# Patient Record
Sex: Female | Born: 1965 | Race: Black or African American | Hispanic: No | Marital: Single | Smoking: Never smoker
Health system: Southern US, Community
[De-identification: ages and names within clinical notes are randomized; demographics above are authoritative.]

## PROBLEM LIST (undated history)

## (undated) DIAGNOSIS — G5600 Carpal tunnel syndrome, unspecified upper limb: Secondary | ICD-10-CM

---

## 2001-08-05 ENCOUNTER — Emergency Department (HOSPITAL_COMMUNITY): Admission: EM | Admit: 2001-08-05 | Discharge: 2001-08-05 | Payer: Self-pay | Admitting: *Deleted

## 2002-01-30 ENCOUNTER — Emergency Department (HOSPITAL_COMMUNITY): Admission: EM | Admit: 2002-01-30 | Discharge: 2002-01-30 | Payer: Self-pay | Admitting: Emergency Medicine

## 2003-12-30 ENCOUNTER — Emergency Department (HOSPITAL_COMMUNITY): Admission: EM | Admit: 2003-12-30 | Discharge: 2003-12-30 | Payer: Self-pay

## 2004-07-11 ENCOUNTER — Emergency Department (HOSPITAL_COMMUNITY): Admission: EM | Admit: 2004-07-11 | Discharge: 2004-07-11 | Payer: Self-pay | Admitting: *Deleted

## 2004-08-03 ENCOUNTER — Emergency Department (HOSPITAL_COMMUNITY): Admission: EM | Admit: 2004-08-03 | Discharge: 2004-08-03 | Payer: Self-pay | Admitting: Emergency Medicine

## 2004-11-19 ENCOUNTER — Emergency Department (HOSPITAL_COMMUNITY): Admission: EM | Admit: 2004-11-19 | Discharge: 2004-11-19 | Payer: Self-pay | Admitting: Emergency Medicine

## 2004-12-18 ENCOUNTER — Emergency Department (HOSPITAL_COMMUNITY): Admission: EM | Admit: 2004-12-18 | Discharge: 2004-12-18 | Payer: Self-pay | Admitting: Emergency Medicine

## 2005-02-25 ENCOUNTER — Emergency Department (HOSPITAL_COMMUNITY): Admission: EM | Admit: 2005-02-25 | Discharge: 2005-02-25 | Payer: Self-pay | Admitting: Emergency Medicine

## 2005-02-26 ENCOUNTER — Emergency Department (HOSPITAL_COMMUNITY): Admission: EM | Admit: 2005-02-26 | Discharge: 2005-02-26 | Payer: Self-pay | Admitting: Emergency Medicine

## 2005-03-05 ENCOUNTER — Emergency Department (HOSPITAL_COMMUNITY): Admission: EM | Admit: 2005-03-05 | Discharge: 2005-03-05 | Payer: Self-pay | Admitting: Emergency Medicine

## 2005-03-10 ENCOUNTER — Emergency Department (HOSPITAL_COMMUNITY): Admission: EM | Admit: 2005-03-10 | Discharge: 2005-03-10 | Payer: Self-pay | Admitting: Emergency Medicine

## 2005-03-19 ENCOUNTER — Emergency Department (HOSPITAL_COMMUNITY): Admission: EM | Admit: 2005-03-19 | Discharge: 2005-03-19 | Payer: Self-pay | Admitting: *Deleted

## 2005-07-28 ENCOUNTER — Emergency Department (HOSPITAL_COMMUNITY): Admission: EM | Admit: 2005-07-28 | Discharge: 2005-07-28 | Payer: Self-pay | Admitting: Emergency Medicine

## 2005-08-14 ENCOUNTER — Emergency Department (HOSPITAL_COMMUNITY): Admission: EM | Admit: 2005-08-14 | Discharge: 2005-08-15 | Payer: Self-pay | Admitting: Emergency Medicine

## 2005-08-24 ENCOUNTER — Emergency Department (HOSPITAL_COMMUNITY): Admission: EM | Admit: 2005-08-24 | Discharge: 2005-08-24 | Payer: Self-pay | Admitting: Family Medicine

## 2005-09-21 ENCOUNTER — Emergency Department (HOSPITAL_COMMUNITY): Admission: EM | Admit: 2005-09-21 | Discharge: 2005-09-22 | Payer: Self-pay | Admitting: Emergency Medicine

## 2006-03-17 ENCOUNTER — Emergency Department (HOSPITAL_COMMUNITY): Admission: EM | Admit: 2006-03-17 | Discharge: 2006-03-17 | Payer: Self-pay | Admitting: Emergency Medicine

## 2006-06-02 ENCOUNTER — Emergency Department (HOSPITAL_COMMUNITY): Admission: EM | Admit: 2006-06-02 | Discharge: 2006-06-02 | Payer: Self-pay | Admitting: Emergency Medicine

## 2006-06-05 ENCOUNTER — Emergency Department (HOSPITAL_COMMUNITY): Admission: EM | Admit: 2006-06-05 | Discharge: 2006-06-05 | Payer: Self-pay | Admitting: Emergency Medicine

## 2006-07-20 ENCOUNTER — Emergency Department (HOSPITAL_COMMUNITY): Admission: EM | Admit: 2006-07-20 | Discharge: 2006-07-20 | Payer: Self-pay | Admitting: Emergency Medicine

## 2006-07-28 ENCOUNTER — Emergency Department (HOSPITAL_COMMUNITY): Admission: EM | Admit: 2006-07-28 | Discharge: 2006-07-28 | Payer: Self-pay | Admitting: Emergency Medicine

## 2006-07-30 ENCOUNTER — Emergency Department (HOSPITAL_COMMUNITY): Admission: EM | Admit: 2006-07-30 | Discharge: 2006-07-30 | Payer: Self-pay | Admitting: Emergency Medicine

## 2006-08-10 ENCOUNTER — Inpatient Hospital Stay (HOSPITAL_COMMUNITY): Admission: AD | Admit: 2006-08-10 | Discharge: 2006-08-10 | Payer: Self-pay | Admitting: Family Medicine

## 2007-02-16 ENCOUNTER — Emergency Department (HOSPITAL_COMMUNITY): Admission: EM | Admit: 2007-02-16 | Discharge: 2007-02-16 | Payer: Self-pay | Admitting: Emergency Medicine

## 2007-02-24 ENCOUNTER — Emergency Department (HOSPITAL_COMMUNITY): Admission: EM | Admit: 2007-02-24 | Discharge: 2007-02-24 | Payer: Self-pay | Admitting: Emergency Medicine

## 2007-03-15 ENCOUNTER — Emergency Department (HOSPITAL_COMMUNITY): Admission: EM | Admit: 2007-03-15 | Discharge: 2007-03-15 | Payer: Self-pay | Admitting: Emergency Medicine

## 2007-04-18 ENCOUNTER — Emergency Department (HOSPITAL_COMMUNITY): Admission: EM | Admit: 2007-04-18 | Discharge: 2007-04-18 | Payer: Self-pay | Admitting: Emergency Medicine

## 2007-05-02 ENCOUNTER — Emergency Department (HOSPITAL_COMMUNITY): Admission: EM | Admit: 2007-05-02 | Discharge: 2007-05-02 | Payer: Self-pay | Admitting: *Deleted

## 2007-05-10 ENCOUNTER — Emergency Department (HOSPITAL_COMMUNITY): Admission: EM | Admit: 2007-05-10 | Discharge: 2007-05-10 | Payer: Self-pay | Admitting: Emergency Medicine

## 2007-05-22 ENCOUNTER — Emergency Department (HOSPITAL_COMMUNITY): Admission: EM | Admit: 2007-05-22 | Discharge: 2007-05-22 | Payer: Self-pay | Admitting: Emergency Medicine

## 2007-06-02 ENCOUNTER — Ambulatory Visit: Payer: Self-pay | Admitting: Nurse Practitioner

## 2007-06-02 DIAGNOSIS — K029 Dental caries, unspecified: Secondary | ICD-10-CM | POA: Insufficient documentation

## 2007-06-02 LAB — CONVERTED CEMR LAB
Basophils Absolute: 0 10*3/uL (ref 0.0–0.1)
Basophils Relative: 0 % (ref 0–1)
Eosinophils Absolute: 0.1 10*3/uL (ref 0.0–0.7)
Eosinophils Relative: 2 % (ref 0–5)
HCT: 42 % (ref 36.0–46.0)
Hemoglobin: 13.9 g/dL (ref 12.0–15.0)
Lymphocytes Relative: 13 % (ref 12–46)
Lymphs Abs: 1.1 10*3/uL (ref 0.7–4.0)
MCHC: 33.1 g/dL (ref 30.0–36.0)
MCV: 93.8 fL (ref 78.0–100.0)
Monocytes Absolute: 0.6 10*3/uL (ref 0.1–1.0)
Monocytes Relative: 6 % (ref 3–12)
Neutro Abs: 6.8 10*3/uL (ref 1.7–7.7)
Neutrophils Relative %: 79 % — ABNORMAL HIGH (ref 43–77)
Platelets: 320 10*3/uL (ref 150–400)
RBC: 4.48 M/uL (ref 3.87–5.11)
RDW: 14 % (ref 11.5–15.5)
WBC: 8.6 10*3/uL (ref 4.0–10.5)

## 2007-06-06 ENCOUNTER — Emergency Department (HOSPITAL_COMMUNITY): Admission: EM | Admit: 2007-06-06 | Discharge: 2007-06-06 | Payer: Self-pay | Admitting: Emergency Medicine

## 2007-06-24 ENCOUNTER — Telehealth (INDEPENDENT_AMBULATORY_CARE_PROVIDER_SITE_OTHER): Payer: Self-pay | Admitting: Nurse Practitioner

## 2007-06-24 ENCOUNTER — Encounter (INDEPENDENT_AMBULATORY_CARE_PROVIDER_SITE_OTHER): Payer: Self-pay | Admitting: Nurse Practitioner

## 2007-07-06 ENCOUNTER — Emergency Department (HOSPITAL_COMMUNITY): Admission: EM | Admit: 2007-07-06 | Discharge: 2007-07-06 | Payer: Self-pay | Admitting: Emergency Medicine

## 2007-07-25 ENCOUNTER — Emergency Department (HOSPITAL_COMMUNITY): Admission: EM | Admit: 2007-07-25 | Discharge: 2007-07-25 | Payer: Self-pay | Admitting: Emergency Medicine

## 2007-09-21 ENCOUNTER — Emergency Department (HOSPITAL_COMMUNITY): Admission: EM | Admit: 2007-09-21 | Discharge: 2007-09-21 | Payer: Self-pay | Admitting: Emergency Medicine

## 2007-09-29 ENCOUNTER — Telehealth (INDEPENDENT_AMBULATORY_CARE_PROVIDER_SITE_OTHER): Payer: Self-pay | Admitting: Nurse Practitioner

## 2007-10-25 ENCOUNTER — Emergency Department (HOSPITAL_COMMUNITY): Admission: EM | Admit: 2007-10-25 | Discharge: 2007-10-25 | Payer: Self-pay | Admitting: Emergency Medicine

## 2007-10-29 ENCOUNTER — Telehealth (INDEPENDENT_AMBULATORY_CARE_PROVIDER_SITE_OTHER): Payer: Self-pay | Admitting: Nurse Practitioner

## 2007-11-05 ENCOUNTER — Ambulatory Visit: Payer: Self-pay | Admitting: Nurse Practitioner

## 2007-11-05 DIAGNOSIS — R636 Underweight: Secondary | ICD-10-CM | POA: Insufficient documentation

## 2007-11-05 LAB — CONVERTED CEMR LAB: Pap Smear: NEGATIVE

## 2007-11-10 ENCOUNTER — Encounter (INDEPENDENT_AMBULATORY_CARE_PROVIDER_SITE_OTHER): Payer: Self-pay | Admitting: Nurse Practitioner

## 2007-11-10 DIAGNOSIS — B009 Herpesviral infection, unspecified: Secondary | ICD-10-CM | POA: Insufficient documentation

## 2007-11-12 ENCOUNTER — Ambulatory Visit (HOSPITAL_COMMUNITY): Admission: RE | Admit: 2007-11-12 | Discharge: 2007-11-12 | Payer: Self-pay | Admitting: Family Medicine

## 2007-11-12 ENCOUNTER — Encounter (INDEPENDENT_AMBULATORY_CARE_PROVIDER_SITE_OTHER): Payer: Self-pay | Admitting: Nurse Practitioner

## 2007-11-18 ENCOUNTER — Encounter (INDEPENDENT_AMBULATORY_CARE_PROVIDER_SITE_OTHER): Payer: Self-pay | Admitting: Internal Medicine

## 2007-11-20 ENCOUNTER — Ambulatory Visit: Payer: Self-pay | Admitting: Nurse Practitioner

## 2007-12-04 ENCOUNTER — Emergency Department (HOSPITAL_COMMUNITY): Admission: EM | Admit: 2007-12-04 | Discharge: 2007-12-04 | Payer: Self-pay | Admitting: Emergency Medicine

## 2008-01-22 ENCOUNTER — Ambulatory Visit: Payer: Self-pay | Admitting: Family Medicine

## 2008-02-03 ENCOUNTER — Ambulatory Visit: Payer: Self-pay | Admitting: *Deleted

## 2008-03-03 ENCOUNTER — Ambulatory Visit: Payer: Self-pay | Admitting: Family Medicine

## 2008-03-20 ENCOUNTER — Emergency Department (HOSPITAL_COMMUNITY): Admission: EM | Admit: 2008-03-20 | Discharge: 2008-03-20 | Payer: Self-pay | Admitting: Emergency Medicine

## 2008-05-21 ENCOUNTER — Emergency Department (HOSPITAL_COMMUNITY): Admission: EM | Admit: 2008-05-21 | Discharge: 2008-05-21 | Payer: Self-pay | Admitting: Emergency Medicine

## 2008-07-02 ENCOUNTER — Ambulatory Visit: Payer: Self-pay | Admitting: Family Medicine

## 2008-07-05 ENCOUNTER — Emergency Department (HOSPITAL_COMMUNITY): Admission: EM | Admit: 2008-07-05 | Discharge: 2008-07-05 | Payer: Self-pay | Admitting: Emergency Medicine

## 2008-07-28 ENCOUNTER — Encounter: Payer: Self-pay | Admitting: Family Medicine

## 2008-07-28 ENCOUNTER — Ambulatory Visit: Payer: Self-pay | Admitting: Family Medicine

## 2008-07-28 LAB — CONVERTED CEMR LAB
Albumin: 4.1 g/dL (ref 3.5–5.2)
Basophils Absolute: 0 10*3/uL (ref 0.0–0.1)
CO2: 22 meq/L (ref 19–32)
Calcium: 8.9 mg/dL (ref 8.4–10.5)
Chloride: 104 meq/L (ref 96–112)
Eosinophils Relative: 1 % (ref 0–5)
Glucose, Bld: 82 mg/dL (ref 70–99)
HCT: 40.4 % (ref 36.0–46.0)
Hemoglobin: 13.5 g/dL (ref 12.0–15.0)
Lymphocytes Relative: 55 % — ABNORMAL HIGH (ref 12–46)
Monocytes Absolute: 0.3 10*3/uL (ref 0.1–1.0)
Potassium: 4.4 meq/L (ref 3.5–5.3)
Prealbumin: 13.5 mg/dL — ABNORMAL LOW (ref 18.0–45.0)
RDW: 13.2 % (ref 11.5–15.5)
Sodium: 139 meq/L (ref 135–145)
TSH: 0.909 microintl units/mL (ref 0.350–4.500)
Total Bilirubin: 0.9 mg/dL (ref 0.3–1.2)
Total Protein: 7.4 g/dL (ref 6.0–8.3)
Vit D, 25-Hydroxy: 25 ng/mL — ABNORMAL LOW (ref 30–89)

## 2008-07-30 ENCOUNTER — Ambulatory Visit (HOSPITAL_COMMUNITY): Admission: RE | Admit: 2008-07-30 | Discharge: 2008-07-30 | Payer: Self-pay | Admitting: Family Medicine

## 2008-08-02 ENCOUNTER — Emergency Department (HOSPITAL_COMMUNITY): Admission: EM | Admit: 2008-08-02 | Discharge: 2008-08-02 | Payer: Self-pay | Admitting: Emergency Medicine

## 2008-09-07 ENCOUNTER — Emergency Department (HOSPITAL_COMMUNITY): Admission: EM | Admit: 2008-09-07 | Discharge: 2008-09-07 | Payer: Self-pay | Admitting: Emergency Medicine

## 2009-03-22 ENCOUNTER — Telehealth (INDEPENDENT_AMBULATORY_CARE_PROVIDER_SITE_OTHER): Payer: Self-pay | Admitting: *Deleted

## 2009-03-23 ENCOUNTER — Other Ambulatory Visit: Admission: RE | Admit: 2009-03-23 | Discharge: 2009-03-23 | Payer: Self-pay | Admitting: Internal Medicine

## 2009-03-23 ENCOUNTER — Ambulatory Visit: Payer: Self-pay | Admitting: Family Medicine

## 2009-03-23 ENCOUNTER — Encounter (INDEPENDENT_AMBULATORY_CARE_PROVIDER_SITE_OTHER): Payer: Self-pay | Admitting: Internal Medicine

## 2009-03-23 LAB — CONVERTED CEMR LAB
Basophils Absolute: 0 10*3/uL (ref 0.0–0.1)
Basophils Relative: 0 % (ref 0–1)
Chlamydia, DNA Probe: NEGATIVE
Eosinophils Absolute: 0.1 10*3/uL (ref 0.0–0.7)
Eosinophils Relative: 1 % (ref 0–5)
GC Probe Amp, Genital: NEGATIVE
HCT: 40.9 % (ref 36.0–46.0)
Hemoglobin: 13.6 g/dL (ref 12.0–15.0)
Lymphocytes Relative: 42 % (ref 12–46)
Lymphs Abs: 1.5 10*3/uL (ref 0.7–4.0)
MCHC: 33.3 g/dL (ref 30.0–36.0)
MCV: 92.5 fL (ref 78.0–100.0)
Monocytes Absolute: 0.3 10*3/uL (ref 0.1–1.0)
Monocytes Relative: 8 % (ref 3–12)
Neutro Abs: 1.7 10*3/uL (ref 1.7–7.7)
Neutrophils Relative %: 49 % (ref 43–77)
Platelets: 298 10*3/uL (ref 150–400)
RBC: 4.42 M/uL (ref 3.87–5.11)
RDW: 13.8 % (ref 11.5–15.5)
WBC: 3.5 10*3/uL — ABNORMAL LOW (ref 4.0–10.5)

## 2009-07-04 ENCOUNTER — Emergency Department (HOSPITAL_COMMUNITY): Admission: EM | Admit: 2009-07-04 | Discharge: 2009-07-04 | Payer: Self-pay | Admitting: Emergency Medicine

## 2009-11-29 ENCOUNTER — Emergency Department (HOSPITAL_COMMUNITY): Admission: EM | Admit: 2009-11-29 | Discharge: 2009-11-29 | Payer: Self-pay | Admitting: Emergency Medicine

## 2009-12-20 ENCOUNTER — Ambulatory Visit: Payer: Self-pay | Admitting: Internal Medicine

## 2009-12-21 ENCOUNTER — Ambulatory Visit (HOSPITAL_COMMUNITY): Admission: RE | Admit: 2009-12-21 | Discharge: 2009-12-21 | Payer: Self-pay | Admitting: Family Medicine

## 2010-05-14 LAB — CONVERTED CEMR LAB
ALT: 9 units/L (ref 0–35)
AST: 14 units/L (ref 0–37)
Albumin: 4.3 g/dL (ref 3.5–5.2)
Alkaline Phosphatase: 72 units/L (ref 39–117)
Amphetamine Screen, Ur: NEGATIVE
BUN: 11 mg/dL (ref 6–23)
Barbiturate Quant, Ur: NEGATIVE
Basophils Absolute: 0 10*3/uL (ref 0.0–0.1)
Basophils Relative: 0 % (ref 0–1)
Benzodiazepines.: NEGATIVE
Bilirubin Urine: NEGATIVE
Blood in Urine, dipstick: NEGATIVE
CO2: 22 meq/L (ref 19–32)
Calcium: 9 mg/dL (ref 8.4–10.5)
Chlamydia, DNA Probe: NEGATIVE
Chloride: 103 meq/L (ref 96–112)
Cocaine Metabolites: NEGATIVE
Creatinine, Ser: 0.62 mg/dL (ref 0.40–1.20)
Creatinine,U: 151.1 mg/dL
Eosinophils Absolute: 0 10*3/uL (ref 0.0–0.7)
Eosinophils Relative: 1 % (ref 0–5)
GC Probe Amp, Genital: NEGATIVE
Glucose, Bld: 71 mg/dL (ref 70–99)
Glucose, Urine, Semiquant: NEGATIVE
HCT: 40.3 % (ref 36.0–46.0)
Hemoglobin: 13.5 g/dL (ref 12.0–15.0)
KOH Prep: NEGATIVE
Ketones, urine, test strip: NEGATIVE
Lymphocytes Relative: 48 % — ABNORMAL HIGH (ref 12–46)
Lymphs Abs: 1.7 10*3/uL (ref 0.7–4.0)
MCHC: 33.5 g/dL (ref 30.0–36.0)
MCV: 93.9 fL (ref 78.0–100.0)
Marijuana Metabolite: NEGATIVE
Methadone: NEGATIVE
Monocytes Absolute: 0.2 10*3/uL (ref 0.1–1.0)
Monocytes Relative: 7 % (ref 3–12)
Neutro Abs: 1.5 10*3/uL — ABNORMAL LOW (ref 1.7–7.7)
Neutrophils Relative %: 44 % (ref 43–77)
Nitrite: NEGATIVE
OCCULT 1: NEGATIVE
Opiate Screen, Urine: NEGATIVE
Phencyclidine (PCP): NEGATIVE
Platelets: 280 10*3/uL (ref 150–400)
Potassium: 4.3 meq/L (ref 3.5–5.3)
Propoxyphene: NEGATIVE
Protein, U semiquant: NEGATIVE
RBC: 4.29 M/uL (ref 3.87–5.11)
RDW: 14.7 % (ref 11.5–15.5)
Sodium: 138 meq/L (ref 135–145)
Specific Gravity, Urine: >=1.03
TSH: 1.057 microintl units/mL (ref 0.350–4.50)
Total Bilirubin: 1.3 mg/dL — ABNORMAL HIGH (ref 0.3–1.2)
Total Protein: 7.3 g/dL (ref 6.0–8.3)
Urobilinogen, UA: 1
WBC Urine, dipstick: NEGATIVE
WBC: 3.5 10*3/uL — ABNORMAL LOW (ref 4.0–10.5)
pH: 5.5

## 2010-05-21 ENCOUNTER — Emergency Department (HOSPITAL_COMMUNITY)
Admission: EM | Admit: 2010-05-21 | Discharge: 2010-05-21 | Disposition: A | Discharge status: Home / Self Care | Payer: Self-pay | Attending: Emergency Medicine | Admitting: Emergency Medicine

## 2010-05-21 DIAGNOSIS — M25539 Pain in unspecified wrist: Secondary | ICD-10-CM | POA: Insufficient documentation

## 2010-05-21 DIAGNOSIS — G5600 Carpal tunnel syndrome, unspecified upper limb: Secondary | ICD-10-CM | POA: Insufficient documentation

## 2010-07-25 ENCOUNTER — Observation Stay (HOSPITAL_COMMUNITY)
Admission: EM | Admit: 2010-07-25 | Discharge: 2010-07-28 | Disposition: A | Discharge status: Still In Hospital | Payer: Self-pay | Attending: Family Medicine | Admitting: Family Medicine

## 2010-07-25 ENCOUNTER — Emergency Department (HOSPITAL_COMMUNITY): Payer: Self-pay

## 2010-07-25 DIAGNOSIS — R0789 Other chest pain: Principal | ICD-10-CM | POA: Insufficient documentation

## 2010-07-25 DIAGNOSIS — K029 Dental caries, unspecified: Secondary | ICD-10-CM | POA: Insufficient documentation

## 2010-07-25 DIAGNOSIS — R0602 Shortness of breath: Secondary | ICD-10-CM | POA: Insufficient documentation

## 2010-07-25 LAB — CBC
Hemoglobin: 13.2 g/dL (ref 12.0–15.0)
MCH: 30.6 pg (ref 26.0–34.0)
MCHC: 33.2 g/dL (ref 30.0–36.0)
MCV: 92.3 fL (ref 78.0–100.0)
Platelets: 300 10*3/uL (ref 150–400)
RDW: 13.6 % (ref 11.5–15.5)

## 2010-07-25 LAB — COMPREHENSIVE METABOLIC PANEL
ALT: 9 U/L (ref 0–35)
AST: 15 U/L (ref 0–37)
BUN: 10 mg/dL (ref 6–23)
CO2: 25 mEq/L (ref 19–32)
Calcium: 8.8 mg/dL (ref 8.4–10.5)
Chloride: 107 mEq/L (ref 96–112)
Creatinine, Ser: 0.92 mg/dL (ref 0.4–1.2)
GFR calc Af Amer: >60 mL/min (ref >60)
GFR calc non Af Amer: >60 mL/min (ref >60)
Glucose, Bld: 74 mg/dL (ref 70–99)
Potassium: 4.2 mEq/L (ref 3.5–5.1)
Sodium: 138 mEq/L (ref 135–145)
Total Bilirubin: 1.2 mg/dL (ref 0.3–1.2)

## 2010-07-25 LAB — POCT CARDIAC MARKERS
CKMB, poc: <1 ng/mL — ABNORMAL LOW (ref 1.0–8.0)
Myoglobin, poc: 29.3 ng/mL (ref 12–200)
Troponin i, poc: <0.05 ng/mL (ref 0.00–0.09)

## 2010-07-25 LAB — DIFFERENTIAL
Basophils Relative: 0 % (ref 0–1)
Eosinophils Absolute: 0.1 10*3/uL (ref 0.0–0.7)
Eosinophils Relative: 1 % (ref 0–5)
Lymphocytes Relative: 37 % (ref 12–46)
Lymphs Abs: 1.9 10*3/uL (ref 0.7–4.0)
Monocytes Absolute: 0.3 10*3/uL (ref 0.1–1.0)
Monocytes Relative: 6 % (ref 3–12)
Neutro Abs: 2.8 10*3/uL (ref 1.7–7.7)
Neutrophils Relative %: 55 % (ref 43–77)

## 2010-07-25 LAB — CARDIAC PANEL(CRET KIN+CKTOT+MB+TROPI)
CK, MB: 0.5 ng/mL (ref 0.3–4.0)
Relative Index: INVALID (ref 0.0–2.5)
Total CK: 56 U/L (ref 7–177)
Troponin I: <0.01 ng/mL (ref 0.00–0.06)

## 2010-07-25 LAB — CK TOTAL AND CKMB (NOT AT ARMC)
CK, MB: 0.5 ng/mL (ref 0.3–4.0)
Total CK: 59 U/L (ref 7–177)

## 2010-07-26 DIAGNOSIS — R072 Precordial pain: Secondary | ICD-10-CM

## 2010-07-26 LAB — LIPID PANEL: Cholesterol: 132 mg/dL (ref 0–200)

## 2010-07-26 LAB — CBC
HCT: 34.9 % — ABNORMAL LOW (ref 36.0–46.0)
Hemoglobin: 11.6 g/dL — ABNORMAL LOW (ref 12.0–15.0)
MCH: 30.1 pg (ref 26.0–34.0)
MCHC: 33.2 g/dL (ref 30.0–36.0)
MCV: 90.4 fL (ref 78.0–100.0)
Platelets: 269 10*3/uL (ref 150–400)
RDW: 13.5 % (ref 11.5–15.5)
WBC: 4.4 10*3/uL (ref 4.0–10.5)

## 2010-07-26 LAB — BASIC METABOLIC PANEL
BUN: 9 mg/dL (ref 6–23)
CO2: 24 mEq/L (ref 19–32)
Calcium: 8.3 mg/dL — ABNORMAL LOW (ref 8.4–10.5)
Chloride: 110 mEq/L (ref 96–112)
Creatinine, Ser: 0.81 mg/dL (ref 0.4–1.2)
GFR calc Af Amer: >60 mL/min (ref >60)
GFR calc non Af Amer: >60 mL/min (ref >60)
Glucose, Bld: 85 mg/dL (ref 70–99)

## 2010-07-26 LAB — POCT CARDIAC MARKERS: Myoglobin, poc: 26.6 ng/mL (ref 12–200)

## 2010-07-26 LAB — MAGNESIUM: Magnesium: 2.2 mg/dL (ref 1.5–2.5)

## 2010-07-26 LAB — CARDIAC PANEL(CRET KIN+CKTOT+MB+TROPI)
CK, MB: 0.5 ng/mL (ref 0.3–4.0)
CK, MB: 0.6 ng/mL (ref 0.3–4.0)
Relative Index: 0.4 (ref 0.0–2.5)
Total CK: 47 U/L (ref 7–177)
Total CK: 149 U/L (ref 7–177)

## 2010-07-26 LAB — RAPID URINE DRUG SCREEN, HOSP PERFORMED
Barbiturates: NOT DETECTED
Benzodiazepines: NOT DETECTED
Cocaine: NOT DETECTED
Opiates: NOT DETECTED

## 2010-07-26 LAB — HEPATITIS PANEL, ACUTE
Hep B C IgM: NEGATIVE
Hepatitis B Surface Ag: NEGATIVE

## 2010-07-26 LAB — TSH: TSH: 1.853 u[IU]/mL (ref 0.350–4.500)

## 2010-07-26 LAB — BRAIN NATRIURETIC PEPTIDE: Pro B Natriuretic peptide (BNP): <30 pg/mL (ref 0.0–100.0)

## 2010-07-26 LAB — PHOSPHORUS: Phosphorus: 4.3 mg/dL (ref 2.3–4.6)

## 2010-07-27 ENCOUNTER — Inpatient Hospital Stay (HOSPITAL_COMMUNITY): Payer: Self-pay

## 2010-07-27 ENCOUNTER — Inpatient Hospital Stay (HOSPITAL_COMMUNITY)
Admit: 2010-07-27 | Discharge: 2010-07-27 | Disposition: A | Discharge status: Home / Self Care | Payer: 59 | Attending: Cardiology | Admitting: Cardiology

## 2010-07-27 MED ORDER — TECHNETIUM TC 99M TETROFOSMIN IV KIT
30.0000 | PACK | Freq: Once | INTRAVENOUS | Status: AC | PRN
Start: 2010-07-27 — End: 2010-07-27
  Administered 2010-07-27: 30 via INTRAVENOUS

## 2010-07-27 MED ORDER — TECHNETIUM TC 99M TETROFOSMIN IV KIT
10.0000 | PACK | Freq: Once | INTRAVENOUS | Status: AC | PRN
  Administered 2010-07-27: 10 via INTRAVENOUS

## 2010-07-28 ENCOUNTER — Observation Stay (HOSPITAL_COMMUNITY)
Admission: RE | Admit: 2010-07-28 | Discharge: 2010-07-28 | Disposition: A | Discharge status: Home / Self Care | Payer: Self-pay | Source: Ambulatory Visit | Attending: Cardiology | Admitting: Cardiology

## 2010-07-28 DIAGNOSIS — R079 Chest pain, unspecified: Secondary | ICD-10-CM | POA: Insufficient documentation

## 2010-07-28 LAB — PROTIME-INR
INR: 1.18 (ref 0.00–1.49)
Prothrombin Time: 15.2 seconds (ref 11.6–15.2)

## 2010-08-17 NOTE — Cardiovascular Report (Signed)
NAMEAUNESTY, TYSON             ACCOUNT NO.:  [id number]  MEDICAL RECORD NO.:  [id number]           PATIENT TYPE:  I  LOCATION:  [facility]                          FACILITY:  [facility]  PHYSICIAN:  Kyrianna Barletta N. Sharyn Lull, M.D. DATE OF BIRTH:  [birth year]  DATE OF PROCEDURE:  07/28/2010 DATE OF DISCHARGE:                           CARDIAC CATHETERIZATION   PROCEDURES:  Left cardiac catheterization with selective left and right coronary angiography, left ventriculography via right groin using Judkins technique.  SURGEON:  Eduardo Osier. Sharyn Lull, MD  INDICATIONS FOR PROCEDURE:  [name] is a 45 year old black female with no significant past medical history who was admitted on July 25, 2010, because of retrosternal chest pain described as tightness, grade 10/10 while at work.  Denies any nausea, vomiting, or diaphoresis. Denies shortness of breath.  Denies palpitation, lightheadedness, or syncope.  Denies PND, orthopnea, or leg swelling.  Denies exertional chest pain in the past.  Denies exertional dyspnea.  Cardiology consult was called due to recurrent chest pain and nonspecific T-wave changes. Denies any cardiac workup in the past.  The patient was ruled out for MI by serial enzymes and EKG and subsequently underwent stress Myoview which showed inferior and anterior wall reversible ischemia.  Due to recurrent chest pain and positive stress Myoview, discussed with the patient regarding left cath, possible PTCA and stenting, its risks and benefits, i.e., death, MI, stroke, need for emergency CABG, risk of restenosis, local vascular complications, etc., and consented for the procedure.  PROCEDURE:  After obtaining informed consent, the patient was brought to the Cath Lab and was placed on the fluoroscopy table.  Right groin was prepped and draped in the usual fashion.  Xylocaine 1% was used for local anesthesia in the right groin.  With the help of thin-wall needle, a 5-French arterial sheath was  placed.  The sheath was aspirated and flushed.  Next, a 5-French left Judkins catheter was advanced over the wire under fluoroscopic guidance up to the ascending aorta.  Wire was pulled out.  The catheter was aspirated and connected to the manifold. Catheter was further advanced and engaged into left coronary ostium. Multiple views of the left system were taken.  Next, the catheter was disengaged and was pulled out over the wire and was replaced with 5- [place] right Judkins catheter which was advanced over the wire under fluoroscopic guidance up to the ascending aorta.  Wire was pulled out. The catheter was aspirated and connected to the manifold.  Catheter was further advanced and engaged into right coronary ostium.  Multiple views of the right system were taken.  Next, the catheter was disengaged and was pulled out over the wire and was replaced with 5-French pigtail catheter which was advanced over the wire under fluoroscopic guidance up to the ascending aorta.  Wire was pulled out.  The catheter was aspirated and connected to the manifold.  Catheter was further advanced across the aortic valve into the LV.  LV pressures were recorded.  Next, LV-graphy was done in 30-degree RAO position.  Post-angiographic pressures were recorded from LV and then pullback pressures were recorded from aorta.  There was no gradient  across the aortic valve. The patient had brief episode of SVT after LV-graphy and then spontaneously converted back to sinus rhythm.  The patient tolerated the procedure well.  There were no complications.  The patient was transferred to the recovery room in stable condition.     Eduardo Osier. Sharyn Lull, M.D.     [facility]  D:  07/28/2010  T:  07/29/2010  Job:  [id number]  cc:   [facility] Team #4  Electronically Signed by Rinaldo Cloud M.D. on 08/17/2010 08:33:17 AM

## 2010-08-20 NOTE — H&P (Signed)
Julie Li, Julie Li             ACCOUNT NO.:  [id number]  MEDICAL RECORD NO.:  [id number]           PATIENT TYPE:  E  LOCATION:  [facility]                         FACILITY:  [facility]  PHYSICIAN:  Lonia Blood, M.D.      DATE OF BIRTH:  [birth year]  DATE OF ADMISSION:  07/25/2010 DATE OF DISCHARGE:                             HISTORY & PHYSICAL   PRIMARY CARE PHYSICIAN:  She is unassigned.  PRESENTING COMPLAINT:  Chest pain.  HISTORY OF PRESENT ILLNESS:  Patient is a 44-hour-old female with no significant past medical history, who presented with 3 days history of progressive chest pain.  Chest pain got worse and it is worse with exertion.  She has had some nausea in its height.  She also rated it as more like pressure, 5/10.  No radiation.  It is relieved by rest. Denied any GI symptoms.  She has had multiple prior ER visits in the past mainly related to her and dental abscess.  She has also had some pain in the left wrist, but has never been admitted per se.  Patient has no significant risk factors for coronary artery disease, but she seemed to be kind of poor historian.  PAST MEDICAL HISTORY:  Significant for: 1. Dental abscess. 2. History of contusion of the right shoulder. 3. Left carpal tunnel syndrome.  ALLERGIES:  She is allergic to: 1. SULFA. 2. TRAMADOL.  MEDICATIONS:  None at this point.  SOCIAL HISTORY:  She lives here in [place].  She denied any tobacco, alcohol or IV drug use.  She is normally active, but does not exercise on a regular basis.  FAMILY HISTORY:  Mainly hypertension, but denied any history of coronary artery disease.  REVIEW OF SYSTEMS:  All systems reviewed are negative except per HPI.  PHYSICAL EXAMINATION:  VITAL SIGNS:  Temperature 98.4, her blood pressure is 104/79 with pulse 86, respiratory rate 18, sats 100% on room air. GENERAL:  She is awake, alert, oriented.  She is in no acute distress. HEENT:  PERRL.  EOMI.  She has poor dentition.   No pallor.  No jaundice. No rhinorrhea. NECK:  Supple.  No JVD.  No lymphadenopathy. RESPIRATORY:  She has good air entry bilaterally.  No wheezes.  No rales.  No crackles. CARDIOVASCULAR SYSTEM:  She has S1, S2.  No audible murmur. ABDOMEN:  Soft, full, nontender with positive bowel sounds. EXTREMITIES:  Showed no edema, cyanosis or clubbing. SKIN EXAMINATION:  No rashes or ulcers.  LABORATORY DATA:  White count is 5.1, hemoglobin 13.2, platelet of 300,000.  Sodium 138, potassium 4.2, chloride 107, CO2 of 25, glucose 74, BUN 10, creatinine 0.92, calcium 8.8.  The rest of the LFTs within normal.  Initial cardiac enzymes are negative.  DIAGNOSTIC STUDIES:  Chest x-ray showed mild interstitial prominence likely technical.  No findings suspicious for pneumonia.  Her EKG showed normal sinus rhythm with a rate of 84, normal intervals.  She has some mild nonspecific ST changes.  ASSESSMENT:  This a 45 year old female with no known cardiac history and no known risk factors for coronary artery disease presenting with, however, classic angina type chest  pain.  She has complained of exertional chest pain, which makes it a little bit worrisome.  Our plan therefore will be: 1. Chest pain:  Admit the patient for observation overnight.  Our goal     will be to rule out myocardial infarction.  If her enzymes are     negative, patient may benefit from a stress test even though her     risk factors again seen is low, but history wise her symptoms seems     very much in line with coronary artery disease.  No apparent     history of gastroesophageal reflux disease. 2. Dental caries:  Again, this is something that patient has been     battling with and need to continue her care as an outpatient.     Other than that, I will only give her aspirin and watch closely.     Lonia Blood, M.D.     Verlin Grills  D:  07/25/2010  T:  07/25/2010  Job:  [id number]  Electronically Signed by Lonia Blood M.D. on  08/20/2010 10:04:34 PM

## 2010-09-18 ENCOUNTER — Emergency Department (HOSPITAL_COMMUNITY)
Admission: EM | Admit: 2010-09-18 | Discharge: 2010-09-19 | Discharge status: Against Medical Advice | Payer: Self-pay | Attending: Emergency Medicine | Admitting: Emergency Medicine

## 2010-09-18 DIAGNOSIS — R509 Fever, unspecified: Secondary | ICD-10-CM | POA: Insufficient documentation

## 2010-09-18 DIAGNOSIS — R070 Pain in throat: Secondary | ICD-10-CM | POA: Insufficient documentation

## 2010-11-21 ENCOUNTER — Emergency Department (HOSPITAL_COMMUNITY)
Admission: EM | Admit: 2010-11-21 | Discharge: 2010-11-21 | Disposition: A | Discharge status: Home / Self Care | Payer: Self-pay | Attending: Emergency Medicine | Admitting: Emergency Medicine

## 2010-11-21 DIAGNOSIS — K089 Disorder of teeth and supporting structures, unspecified: Secondary | ICD-10-CM | POA: Insufficient documentation

## 2010-11-21 DIAGNOSIS — K029 Dental caries, unspecified: Secondary | ICD-10-CM | POA: Insufficient documentation

## 2010-12-13 ENCOUNTER — Emergency Department (HOSPITAL_COMMUNITY)
Admission: EM | Admit: 2010-12-13 | Discharge: 2010-12-13 | Disposition: A | Discharge status: Home / Self Care | Payer: Self-pay | Attending: Emergency Medicine | Admitting: Emergency Medicine

## 2010-12-13 DIAGNOSIS — M25539 Pain in unspecified wrist: Secondary | ICD-10-CM | POA: Insufficient documentation

## 2010-12-13 DIAGNOSIS — R29898 Other symptoms and signs involving the musculoskeletal system: Secondary | ICD-10-CM | POA: Insufficient documentation

## 2010-12-13 DIAGNOSIS — K089 Disorder of teeth and supporting structures, unspecified: Secondary | ICD-10-CM | POA: Insufficient documentation

## 2010-12-22 ENCOUNTER — Emergency Department (HOSPITAL_COMMUNITY)
Admission: EM | Admit: 2010-12-22 | Discharge: 2010-12-22 | Disposition: A | Discharge status: Home / Self Care | Payer: Self-pay | Attending: Emergency Medicine | Admitting: Emergency Medicine

## 2011-01-10 ENCOUNTER — Emergency Department (HOSPITAL_COMMUNITY)
Admission: EM | Admit: 2011-01-10 | Discharge: 2011-01-10 | Disposition: A | Discharge status: Home / Self Care | Payer: Self-pay | Attending: Emergency Medicine | Admitting: Emergency Medicine

## 2011-01-10 DIAGNOSIS — M549 Dorsalgia, unspecified: Secondary | ICD-10-CM | POA: Insufficient documentation

## 2011-05-08 ENCOUNTER — Encounter (HOSPITAL_COMMUNITY): Payer: Self-pay | Admitting: Emergency Medicine

## 2011-05-08 ENCOUNTER — Emergency Department (HOSPITAL_COMMUNITY)
Admission: EM | Admit: 2011-05-08 | Discharge: 2011-05-08 | Disposition: A | Discharge status: Home / Self Care | Payer: Self-pay | Attending: Emergency Medicine | Admitting: Emergency Medicine

## 2011-05-08 DIAGNOSIS — R209 Unspecified disturbances of skin sensation: Secondary | ICD-10-CM | POA: Insufficient documentation

## 2011-05-08 DIAGNOSIS — M25539 Pain in unspecified wrist: Secondary | ICD-10-CM | POA: Insufficient documentation

## 2011-05-08 DIAGNOSIS — G5600 Carpal tunnel syndrome, unspecified upper limb: Secondary | ICD-10-CM | POA: Insufficient documentation

## 2011-05-08 DIAGNOSIS — M79609 Pain in unspecified limb: Secondary | ICD-10-CM | POA: Insufficient documentation

## 2011-05-08 DIAGNOSIS — G5602 Carpal tunnel syndrome, left upper limb: Secondary | ICD-10-CM

## 2011-05-08 MED ORDER — PREDNISONE 20 MG PO TABS
40.0000 mg | ORAL_TABLET | Freq: Once | ORAL | Status: AC
  Administered 2011-05-08: 40 mg via ORAL
  Filled 2011-05-08: qty 2

## 2011-05-08 MED ORDER — PREDNISONE 10 MG PO TABS: 20.0000 mg | ORAL_TABLET | ORAL | Status: DC

## 2011-05-08 NOTE — ED Notes (Signed)
Ortho paged and at bedside

## 2011-05-08 NOTE — ED Notes (Signed)
Pt c/o left wrist pain x 2 days; pt sts hx of same; pt denies obvious injury; CMS intact

## 2011-05-08 NOTE — ED Provider Notes (Signed)
Medical screening examination/treatment/procedure(s) were performed by non-physician practitioner and as supervising physician I was immediately available for consultation/collaboration.  Gerhard Munch, MD 05/08/11 [phone]

## 2011-05-08 NOTE — Progress Notes (Signed)
Orthopedic Tech Progress Note Patient Details:  Julie Li 06/20/1965 [id number]  Type of Splint: Other (comment) Splint Location: left velcro wrist splint Splint Interventions: Application    Gaye Pollack 05/08/2011, 8:03 AM

## 2011-05-08 NOTE — ED Provider Notes (Signed)
History     CSN: [id number]  Arrival date & time 05/08/11  4098   First MD Initiated Contact with Patient 05/08/11 [phone]      Chief Complaint  Patient presents with  . Wrist Pain     Patient is a 46 y.o. female presenting with wrist pain. The history is provided by the patient.  Wrist Pain This is a recurrent problem. The current episode started yesterday. The problem occurs constantly. The problem has been gradually worsening. Pertinent negatives include no chills, fever, numbness, rash or weakness.  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 C/o throbbing pain that radiates from the wrist proximally to the upper arm that is severe and waxes/wanes. There is sometimes associated numbness to the 2nd and 3rd digits, though pt denies this is present at time of examination. She has been taking aleve for the symptoms with mild improvement. Does report a prior dx of carpal tunnel syndrome with sx that resolved after an unknown time.  History reviewed. No pertinent past medical history.  History reviewed. No pertinent past surgical history.  History reviewed. No pertinent family history.  History  Substance Use Topics  . Smoking status: Never Smoker   . Smokeless tobacco: Not on file  . Alcohol Use: No     Review of Systems  Constitutional: Negative for fever and chills.  Musculoskeletal:       See HPI  Skin: Negative for color change, rash and wound.  Neurological: Negative for weakness and numbness.     Allergies  Sulfonamide derivatives and Tramadol hcl  Home Medications  No current outpatient prescriptions on file.  BP 100/67  Pulse 89  Temp(Src) 98.2 F (36.8 C) (Oral)  Resp 16  SpO2 96%  Physical Exam  Nursing note and vitals reviewed. Constitutional: She is oriented to person, place, and time. She appears well-developed and well-nourished. No distress.  HENT:  Head: Normocephalic and atraumatic.    Neck: Neck supple.  Cardiovascular: Normal rate and regular rhythm.   Pulmonary/Chest: Effort normal.  Musculoskeletal: Normal range of motion. She exhibits tenderness. She exhibits no edema.       Right hand: She exhibits normal range of motion, normal two-point discrimination and normal capillary refill. normal sensation noted. She exhibits no finger abduction, no thumb/finger opposition and no wrist extension trouble.       Positive Tinel sign on the left. Positive Phalen's on the left after 15 seconds.  Neurological: She is alert and oriented to person, place, and time.       Sensation intact to light touch. Good 2 point discrimination.  Skin: Skin is warm and dry. No rash noted. No erythema.    ED Course  Procedures (including critical care time)  Labs Reviewed - No data to display No results found.   Dx 1: Carpal tunnel syndrome, left   MDM  Exam c/w CTS. No gross neuro or motor deficits. Splint applied. Will provide short steroid course and advised continued NSAID use for pain. Recommended ortho f/u for further testing/tx if no improvement after conservative tx measures        Shaaron Adler, [place] 05/08/11 [phone]

## 2011-05-26 ENCOUNTER — Emergency Department (HOSPITAL_COMMUNITY)
Admission: EM | Admit: 2011-05-26 | Discharge: 2011-05-26 | Disposition: A | Discharge status: Home / Self Care | Payer: Self-pay | Attending: Emergency Medicine | Admitting: Emergency Medicine

## 2011-05-26 ENCOUNTER — Encounter (HOSPITAL_COMMUNITY): Payer: Self-pay

## 2011-05-26 DIAGNOSIS — R05 Cough: Secondary | ICD-10-CM | POA: Insufficient documentation

## 2011-05-26 DIAGNOSIS — R5381 Other malaise: Secondary | ICD-10-CM | POA: Insufficient documentation

## 2011-05-26 DIAGNOSIS — J111 Influenza due to unidentified influenza virus with other respiratory manifestations: Secondary | ICD-10-CM | POA: Insufficient documentation

## 2011-05-26 DIAGNOSIS — IMO0001 Reserved for inherently not codable concepts without codable children: Secondary | ICD-10-CM | POA: Insufficient documentation

## 2011-05-26 DIAGNOSIS — J3489 Other specified disorders of nose and nasal sinuses: Secondary | ICD-10-CM | POA: Insufficient documentation

## 2011-05-26 DIAGNOSIS — R059 Cough, unspecified: Secondary | ICD-10-CM | POA: Insufficient documentation

## 2011-05-26 DIAGNOSIS — R509 Fever, unspecified: Secondary | ICD-10-CM | POA: Insufficient documentation

## 2011-05-26 MED ORDER — OSELTAMIVIR PHOSPHATE 75 MG PO CAPS: 75.0000 mg | ORAL_CAPSULE | Freq: Two times a day (BID) | ORAL | Status: AC

## 2011-05-26 MED ORDER — OSELTAMIVIR PHOSPHATE 75 MG PO CAPS: 75.0000 mg | ORAL_CAPSULE | Freq: Two times a day (BID) | ORAL | Status: DC

## 2011-05-26 NOTE — ED Notes (Signed)
Pt states she has had generalized body aches and fever since yesterday.  Congestion and cough.  Pt states she had fever of 103 at home.  Took mucinex and robitussin about 1900 tonight.  Pt states pain rating is 9/10 for general all over body aches. Pt alert and oriented x4.  Denies N/V/D

## 2011-05-26 NOTE — ED Provider Notes (Signed)
History     CSN: [id number]  Arrival date & time 05/26/11  1943   First MD Initiated Contact with Patient 05/26/11 1957      Chief Complaint  Patient presents with  . Generalized Body Aches    pt states "I think I have the flu"     HPI  Hx provided by pt and significant other.  Julie Li is a 46 y.o. female who presents to the Emergency Department complaining of fever, body aches, and nasal congestion that began yesterday morning after waking up.  Pt was in contact with landlord who was sick recently with similar symptoms. She reports having a fever of 103 at home. Patient has been taking a fever reducer and Robitussin and Mucinex. Patient reports continued bodyaches and fatigue. She also reports slight cough developing later this evening. She denies any chest pain or shortness of breath. She denies any episodes of nausea vomiting diarrhea. Patient has slightly decreased appetite but is otherwise eating and drinking normally. Patient denies any aggravating or alleviating factors. Symptoms are described as moderate. Patient did not receive a flu vaccination this season. Patient has no other significant medical conditions.        History reviewed. No pertinent past medical history.  History reviewed. No pertinent past surgical history.  No family history on file.  History  Substance Use Topics  . Smoking status: Never Smoker   . Smokeless tobacco: Not on file  . Alcohol Use: No    OB History    Grav Para Term Preterm Abortions TAB SAB Ect Mult Living                  Review of Systems  Constitutional: Positive for fever, chills, appetite change and fatigue.  HENT: Positive for congestion and rhinorrhea. Negative for sore throat.   Respiratory: Negative for cough and shortness of breath.   Cardiovascular: Negative for chest pain.  Gastrointestinal: Negative for nausea, vomiting, abdominal pain, diarrhea and constipation.  Musculoskeletal: Positive for myalgias.    Skin: Negative for rash.  All other systems reviewed and are negative.    Allergies  Sulfonamide derivatives and Tramadol hcl  Home Medications   Current Outpatient Rx  Name Route Sig Dispense Refill  . GUAIFENESIN ER 600 MG PO TB12 Oral Take 1,200 mg by mouth 2 (two) times daily as needed. For cough.    . GUAIFENESIN 100 MG/5ML PO SOLN Oral Take 5 mLs by mouth every 4 (four) hours as needed. For cough, allergies.      BP 121/79  Pulse 102  Temp(Src) 100 F (37.8 C) (Oral)  Resp 20  SpO2 98%  LMP 05/04/2011  Physical Exam  Nursing note and vitals reviewed. Constitutional: She is oriented to person, place, and time. She appears well-developed and well-nourished. No distress.  HENT:  Head: Normocephalic.  Right Ear: External ear normal.  Left Ear: External ear normal.  Mouth/Throat: Oropharynx is clear and moist.       Rhinnorrhea  Neck: Normal range of motion. Neck supple.       No meningeal signs   Cardiovascular: Normal rate and regular rhythm.   Pulmonary/Chest: Effort normal and breath sounds normal. No respiratory distress. She has no wheezes. She has no rales.  Abdominal: Soft.  Neurological: She is alert and oriented to person, place, and time.  Skin: Skin is warm and dry. No rash noted.  Psychiatric: She has a normal mood and affect. Her behavior is normal.    ED  Course  Procedures   1. Influenza       MDM  8:10PM pt seen and evaluated. She in no acute distress. Patient with normal respirations and good O2 sats. Lungs are clear on exam. Symptoms are consistent with viral infection.   Medical screening examination/treatment/procedure(s) were performed by non-physician practitioner and as supervising physician I was immediately available for consultation/collaboration. Osvaldo Human, M.D.      Phill Mutter [place], PA 05/27/11 1610  Carleene Cooper III, MD 05/27/11 [phone]

## 2011-05-26 NOTE — ED Notes (Signed)
Pt c/o body aches, sore throat, nose is stopped up, etc.

## 2011-08-29 ENCOUNTER — Encounter (HOSPITAL_COMMUNITY): Payer: Self-pay

## 2011-08-29 ENCOUNTER — Emergency Department (HOSPITAL_COMMUNITY)
Admission: EM | Admit: 2011-08-29 | Discharge: 2011-08-29 | Disposition: A | Discharge status: Home / Self Care | Payer: Self-pay | Attending: Emergency Medicine | Admitting: Emergency Medicine

## 2011-08-29 DIAGNOSIS — K089 Disorder of teeth and supporting structures, unspecified: Secondary | ICD-10-CM | POA: Insufficient documentation

## 2011-08-29 DIAGNOSIS — R51 Headache: Secondary | ICD-10-CM | POA: Insufficient documentation

## 2011-08-29 DIAGNOSIS — K0889 Other specified disorders of teeth and supporting structures: Secondary | ICD-10-CM

## 2011-08-29 DIAGNOSIS — K0510 Chronic gingivitis, plaque induced: Secondary | ICD-10-CM | POA: Insufficient documentation

## 2011-08-29 MED ORDER — NAPROXEN 500 MG PO TABS: 500.0000 mg | ORAL_TABLET | Freq: Two times a day (BID) | ORAL | Status: DC

## 2011-08-29 MED ORDER — HYDROCODONE-ACETAMINOPHEN 5-325 MG PO TABS
1.0000 | ORAL_TABLET | Freq: Once | ORAL | Status: AC
  Administered 2011-08-29: 1 via ORAL
  Filled 2011-08-29: qty 1

## 2011-08-29 MED ORDER — HYDROCODONE-ACETAMINOPHEN 5-325 MG PO TABS: ORAL_TABLET | ORAL | Status: AC

## 2011-08-29 MED ORDER — PENICILLIN V POTASSIUM 500 MG PO TABS: 500.0000 mg | ORAL_TABLET | Freq: Three times a day (TID) | ORAL | Status: AC

## 2011-08-29 NOTE — ED Notes (Signed)
C/o pain to left front upper tooth; obvious decay noted to all teeth in upper and lower parts of mouth; extremely poor dentation; states has been unable to get in with dentist thus far

## 2011-08-29 NOTE — ED Provider Notes (Signed)
History     CSN: [id number]  Arrival date & time 08/29/11  1455   First MD Initiated Contact with Patient 08/29/11 1527      Chief Complaint  Patient presents with  . Dental Pain    (Consider location/radiation/quality/duration/timing/severity/associated sxs/prior treatment) HPI Comments: Patient states she chipped L maxillary second incisor yesterday. Has had pain since. No facial or neck swelling. No trouble breathing. Taking aleve without relief. No fever. Nothing makes pain better or worse. Pain radiates to L jaw. Pain is throbbing.   Patient is a 46 y.o. female presenting with tooth pain. The history is provided by the patient.  Dental PainThe primary symptoms include mouth pain, dental injury and headaches. Primary symptoms do not include oral lesions, fever, shortness of breath, sore throat or angioedema. The symptoms began yesterday. The symptoms are unchanged. The symptoms are new.  Additional symptoms do not include: facial swelling, trouble swallowing and ear pain.    History reviewed. No pertinent past medical history.  History reviewed. No pertinent past surgical history.  History reviewed. No pertinent family history.  History  Substance Use Topics  . Smoking status: Never Smoker   . Smokeless tobacco: Not on file  . Alcohol Use: No    OB History    Grav Para Term Preterm Abortions TAB SAB Ect Mult Living                  Review of Systems  Constitutional: Negative for fever.  HENT: Positive for dental problem. Negative for ear pain, sore throat, facial swelling, trouble swallowing and neck pain.   Respiratory: Negative for shortness of breath and stridor.   Skin: Negative for color change.  Neurological: Positive for headaches.    Allergies  Sulfonamide derivatives and Tramadol hcl  Home Medications  No current outpatient prescriptions on file.  BP 95/64  Pulse 91  Temp(Src) 98.2 F (36.8 C) (Oral)  Resp 18  SpO2 100%  Physical Exam    Nursing note and vitals reviewed. Constitutional: She is oriented to person, place, and time. She appears well-developed and well-nourished.  HENT:  Head: Normocephalic and atraumatic. No trismus in the jaw.  Right Ear: Tympanic membrane, external ear and ear canal normal.  Left Ear: Tympanic membrane, external ear and ear canal normal.  Nose: Nose normal.  Mouth/Throat: Uvula is midline, oropharynx is clear and moist and mucous membranes are normal. Abnormal dentition. Dental caries present. No dental abscesses or uvula swelling. No tonsillar abscesses.       Advanced periodontal disease. Patient with L maxillary tooth pain and tenderness to palpation in area of L 2nd incisior which is broken to gumline. Multiple surrounding teeth missing. Generalized redness of gums without swelling.   Eyes: Conjunctivae are normal.  Neck: Normal range of motion. Neck supple.       No neck swelling or Ludwig's angina  Lymphadenopathy:    She has no cervical adenopathy.  Neurological: She is alert and oriented to person, place, and time.  Skin: Skin is warm and dry.  Psychiatric: She has a normal mood and affect.    ED Course  Procedures (including critical care time)  Labs Reviewed - No data to display No results found.   1. Toothache   2. Gingivitis     3:58 PM Patient seen and examined. Medications ordered.   Vital signs reviewed and are as follows: Filed Vitals:   08/29/11 1500  BP: 95/64  Pulse: 91  Temp: 98.2 F (36.8 C)  Resp: 18   3:58 PM Patient counseled to take prescribed medications as directed, return with worsening facial or neck swelling, and to follow-up with his dentist as soon as possible.   3:58 PM Patient counseled on use of narcotic pain medications. Counseled not to combine these medications with others containing tylenol. Urged not to drink alcohol, drive, or perform any other activities that requires focus while taking these medications. The patient verbalizes  understanding and agrees with the plan.  MDM  Patient with toothache.  No gross abscess.  Exam unconcerning for Ludwig's angina or other deep tissue infection in neck.  Will treat with penicillin and pain medicine.  Urged patient to follow-up with dentist.           Renne Crigler, PA 08/29/11 [phone]

## 2011-08-29 NOTE — ED Provider Notes (Signed)
Medical screening examination/treatment/procedure(s) were performed by non-physician practitioner and as supervising physician I was immediately available for consultation/collaboration.  Doug Sou, MD 08/29/11 2128

## 2011-08-29 NOTE — ED Notes (Signed)
Pt complains of dental pain since yesterday unable to get in with dentist

## 2011-08-29 NOTE — Discharge Instructions (Signed)
Please read and follow all provided instructions.  Your diagnoses today include:  1. Toothache   2. Gingivitis     Tests performed today include:  Vital signs. See below for your results today.   Medications prescribed:   Vicodin (hydrocodone/acetaminophen) - narcotic pain medication  You have been prescribed narcotic pain medication such as Vicodin, Percocet, or Ultram: DO NOT drive or perform any activities that require you to be awake and alert because this medicine can make you drowsy. BE VERY CAREFUL not to take multiple medicines containing Tylenol (also called acetaminophen). Doing so can lead to an overdose which can damage your liver and cause liver failure and possibly death.    Naproxen - anti-inflammatory pain medication  Do not exceed 500mg  naproxen every 12 hours  You have been prescribed an anti-inflammatory medication or NSAID. Take with food. Take smallest effective dose for the shortest duration needed for your pain. Stop taking if you experience stomach pain or vomiting.    Penicillin - antibiotic for dental infection  You have been prescribed an antibiotic medicine: take the entire course of medicine even if you are feeling better. Stopping early can cause the antibiotic not to work.  Take any prescribed medications only as directed.  Home care instructions:  Follow any educational materials contained in this packet.  Follow-up instructions: Please follow-up with your dentist for further evaluation of your symptoms. If you do not have a dentist or primary care doctor -- see below for referral information.   The exam and treatment you received today has been provided on an emergency basis only. This is not a substitute for complete medical or dental care. If your problem worsens or new symptoms (problems) appear, and you are unable to arrange prompt follow-up care with your dentist, return to this location.  Return instructions:   Please return to the  Emergency Department if you experience worsening symptoms.  Please return if you develop a fever, you develop more swelling in your face or neck, you have trouble breathing or swallowing food.  Please return if you have any other emergent concerns.  Additional Information:  Your vital signs today were: BP 95/64  Pulse 91  Temp(Src) 98.2 F (36.8 C) (Oral)  Resp 18  SpO2 100% If your blood pressure (BP) was elevated above 135/85 this visit, please have this repeated by your doctor within one month. -------------- No Primary Care Doctor Call [facility]  [phone] Other agencies that provide inexpensive medical care    [facility]  [phone]    [facility]  [phone]    [facility]  [phone]    [facility]  [phone]    [facility]  [phone]    [facility]  [phone] -------------- RESOURCE GUIDE:  Dental Problems  Patients with Medicaid: [facility] [phone] [address]                                            [phone] [address] Phone:  [phone]  Phone:  [phone]  If unable to pay or uninsured, contact:  [facility] or [facility]. to become qualified for the adult dental clinic.  Chronic Pain Problems Contact [facility]  [phone] Patients need to be referred by their primary care doctor.  Insufficient Money for Medicine Contact [employer]:  call "211" or [facility] [phone].  [facility]  [phone] [facility]  [phone] [facility]   [phone] (emergency services [phone])  Sub[facility] Abuse [facility]  [phone] [facility] [phone] [facility] [place] [phone] Floydene Flock [phone] Residential & Outpatient Substance Abuse Program   [phone]  Abuse/Neglect [facility] Child Abuse Hotline [phone] [facility] Child Abuse Hotline [phone] (After Hours)  Emergency Shelter [facility] [phone]  Maternity Homes Room at the [facility] [phone] [facility] [phone]  [facility]  [facility]     [employer]                          [facility] Dept. [address] [place]                       [address]      [address]  Blondell Reveal Phone:  [phone]                                   Phone:  [phone]                 Phone:  [phone]  [facility] Mental Health Phone:  [phone]  [facility] Child Abuse Hotline (250)721-0477 [phone])[phone] (After Hours)

## 2011-09-26 ENCOUNTER — Ambulatory Visit: Payer: Self-pay

## 2011-10-28 ENCOUNTER — Encounter (HOSPITAL_COMMUNITY): Payer: Self-pay | Admitting: Emergency Medicine

## 2011-10-28 ENCOUNTER — Emergency Department (HOSPITAL_COMMUNITY)
Admission: EM | Admit: 2011-10-28 | Discharge: 2011-10-28 | Disposition: A | Discharge status: Home / Self Care | Payer: Self-pay | Attending: Emergency Medicine | Admitting: Emergency Medicine

## 2011-10-28 DIAGNOSIS — K089 Disorder of teeth and supporting structures, unspecified: Secondary | ICD-10-CM | POA: Insufficient documentation

## 2011-10-28 DIAGNOSIS — K0889 Other specified disorders of teeth and supporting structures: Secondary | ICD-10-CM

## 2011-10-28 MED ORDER — IBUPROFEN 600 MG PO TABS: 600.0000 mg | ORAL_TABLET | Freq: Four times a day (QID) | ORAL | Status: AC | PRN

## 2011-10-28 MED ORDER — PENICILLIN V POTASSIUM 500 MG PO TABS: 500.0000 mg | ORAL_TABLET | Freq: Three times a day (TID) | ORAL | Status: AC

## 2011-10-28 MED ORDER — OXYCODONE-ACETAMINOPHEN 5-325 MG PO TABS: 1.0000 | ORAL_TABLET | Freq: Four times a day (QID) | ORAL | Status: AC | PRN

## 2011-10-28 NOTE — ED Notes (Signed)
Pt. Stated. I've had a toothache since yesterday.

## 2011-10-28 NOTE — ED Provider Notes (Signed)
History  Scribed for Ethelda Chick, MD, the patient was seen in room TR07C/TR07C. This chart was scribed by Candelaria Stagers. The patient's care started at 11:50 AM   CSN: [id number]  Arrival date & time 10/28/11  1124   None     Chief Complaint  Patient presents with  . Dental Pain     The history is provided by the patient.   Julie Li is a 46 y.o. female who presents to the Emergency Department complaining of left lower dental pain that started yesterday.  She denies fever, trouble swallowing, trouble breathing.  She has never experienced these sx before.  Nothing seems to make the pain better or worse.  She has not taken anything for the symptoms prior to arrival.  No fever/chills.  There are no other associated systemic symptoms, there are no other alleviating or modifying factors.  History reviewed. No pertinent past medical history.  History reviewed. No pertinent past surgical history.  No family history on file.  History  Substance Use Topics  . Smoking status: Never Smoker   . Smokeless tobacco: Not on file  . Alcohol Use: No    OB History    Grav Para Term Preterm Abortions TAB SAB Ect Mult Living                  Review of Systems  Constitutional: Negative for fever.  HENT: Positive for dental problem (lower left ). Negative for trouble swallowing.   Respiratory: Negative for shortness of breath.   All other systems reviewed and are negative.    Allergies  Sulfonamide derivatives and Tramadol hcl  Home Medications   Current Outpatient Rx  Name Route Sig Dispense Refill  . IBUPROFEN 600 MG PO TABS Oral Take 1 tablet (600 mg total) by mouth every 6 (six) hours as needed for pain. 30 tablet 0  . OXYCODONE-ACETAMINOPHEN 5-325 MG PO TABS Oral Take 1-2 tablets by mouth every 6 (six) hours as needed for pain. 15 tablet 0  . PENICILLIN V POTASSIUM 500 MG PO TABS Oral Take 1 tablet (500 mg total) by mouth 3 (three) times daily. 30 tablet 0    BP  111/76  Pulse 97  Temp 98.1 F (36.7 C) (Oral)  SpO2 99%  LMP 10/12/2011  Physical Exam  Nursing note and vitals reviewed. Constitutional: She is oriented to person, place, and time. She appears well-developed and well-nourished. No distress.  HENT:  Head: Normocephalic and atraumatic.       Poor dentition.  Lower left lateral incisor.  No gingival erythema or swelling.    Eyes: Right eye exhibits no discharge. Left eye exhibits no discharge.  Neck: Normal range of motion.  Musculoskeletal: Normal range of motion. She exhibits no edema and no tenderness.  Lymphadenopathy:    She has no cervical adenopathy.  Neurological: She is alert and oriented to person, place, and time.  Skin: Skin is warm and dry. She is not diaphoretic.  Psychiatric: She has a normal mood and affect. Her behavior is normal. Judgment and thought content normal.    ED Course  Procedures   DIAGNOSTIC STUDIES: Oxygen Saturation is 99% on room air, normal by my interpretation.    COORDINATION OF CARE:     Labs Reviewed - No data to display No results found.   1. Pain, dental       MDM  Pt presents with c/o dental pain, no periapical abscess at this time.  Pt started on pain  medications and antibitoics.  Encouraged to arrange for followup with dentist.  Discharged with strict return precautions.  Pt agreeable with plan.   I personally performed the services described in this documentation, which was scribed in my presence. The recorded information has been reviewed and considered.         Ethelda Chick, MD 10/28/11 1350

## 2012-02-02 ENCOUNTER — Encounter (HOSPITAL_COMMUNITY): Payer: Self-pay | Admitting: Emergency Medicine

## 2012-02-02 ENCOUNTER — Emergency Department (HOSPITAL_COMMUNITY): Payer: Self-pay

## 2012-02-02 ENCOUNTER — Emergency Department (HOSPITAL_COMMUNITY)
Admission: EM | Admit: 2012-02-02 | Discharge: 2012-02-02 | Disposition: A | Discharge status: Home / Self Care | Payer: Self-pay | Attending: Emergency Medicine | Admitting: Emergency Medicine

## 2012-02-02 DIAGNOSIS — Z882 Allergy status to sulfonamides status: Secondary | ICD-10-CM | POA: Insufficient documentation

## 2012-02-02 DIAGNOSIS — M67439 Ganglion, unspecified wrist: Secondary | ICD-10-CM

## 2012-02-02 DIAGNOSIS — G5602 Carpal tunnel syndrome, left upper limb: Secondary | ICD-10-CM

## 2012-02-02 DIAGNOSIS — G5600 Carpal tunnel syndrome, unspecified upper limb: Secondary | ICD-10-CM | POA: Insufficient documentation

## 2012-02-02 DIAGNOSIS — M6740 Ganglion, unspecified site: Secondary | ICD-10-CM | POA: Insufficient documentation

## 2012-02-02 HISTORY — DX: Carpal tunnel syndrome, unspecified upper limb: G56.00

## 2012-02-02 MED ORDER — HYDROCODONE-ACETAMINOPHEN 5-325 MG PO TABS: 1.0000 | ORAL_TABLET | ORAL | Status: DC | PRN

## 2012-02-02 MED ORDER — NAPROXEN 500 MG PO TABS: 500.0000 mg | ORAL_TABLET | Freq: Two times a day (BID) | ORAL | Status: DC

## 2012-02-02 NOTE — ED Provider Notes (Signed)
History  This chart was scribed for Dione Booze, MD by Bennett Scrape. This patient was seen in room TR10C/TR10C and the patient's care was started at 11:00AM.  CSN: [id number]  Arrival date & time 02/02/12  0909   First MD Initiated Contact with Patient 02/02/12 1100      Chief Complaint  Patient presents with  . Wrist Pain    The history is provided by the patient. No language interpreter was used.    Julie Li is a 46 y.o. female who presents to the Emergency Department complaining of 2 days of chronic, gradually worsening, constant left wrist that wraps around the entire wrist described as throbbing and radiates up the left arm that she attributes to carpal tunnel diagnosed several years ago. She rates her pain a 9 out of 10 and states that the pain is worse with lifting and at night. She denies an recent falls or injuries to the area that made the pain worse. She reports using a wrist splint while at work with mild improvement in her symptoms. She states that she used to be on Vicodin for her pain but denies being on Vicodin currently. She denies numbness and weakness as associated symptoms. She denies smoking and alcohol use.  No PCP.  Past Medical History  Diagnosis Date  . Carpal tunnel syndrome     History reviewed. No pertinent past surgical history.  History reviewed. No pertinent family history.  History  Substance Use Topics  . Smoking status: Never Smoker   . Smokeless tobacco: Not on file  . Alcohol Use: No    No OB history provided.  Review of Systems  Constitutional: Negative for fever and chills.  Musculoskeletal: Negative for back pain.       Positive for left wrist pain  Neurological: Negative for weakness and numbness.  All other systems reviewed and are negative.    Allergies  Sulfonamide derivatives and Tramadol hcl  Home Medications  No current outpatient prescriptions on file.  Triage Vitals: BP 97/68  Pulse 87  Temp 98.7 F  (37.1 C) (Oral)  Resp 18  SpO2 100%  LMP 01/30/2012  Physical Exam  Nursing note and vitals reviewed. Constitutional: She is oriented to person, place, and time. She appears well-developed and well-nourished. No distress.  HENT:  Head: Normocephalic and atraumatic.  Eyes: EOM are normal.  Neck: Neck supple. No tracheal deviation present.  Cardiovascular: Normal rate.   Pulmonary/Chest: Effort normal. No respiratory distress.  Musculoskeletal: Normal range of motion.       Ganglion cysts present on the dorsum, freely movable and non-tender, diffuse tenderness throughout the left wrist, no swelling,on the left wrist, muscle atrophy of the thenar and interosseus muscles of the left hand, marked weakness of pincher grasp bilaterally, no sensory loss  Neurological: She is alert and oriented to person, place, and time.  Skin: Skin is warm and dry.  Psychiatric: She has a normal mood and affect. Her behavior is normal.    ED Course  Procedures (including critical care time)  DIAGNOSTIC STUDIES: Oxygen Saturation is 100% on room air, normal by my interpretation.    COORDINATION OF CARE: 11:07AM-Discussed discharge plan of naprosyn twice a day, pain medication and referral to a hand specialist with pt at bedside and pt agreed to plan.  11:17AM-Ordered 500 MG naprosyn and 5-325 MG Norco. Informed pt of negative radiology results. Reviewed discharge instructions.  Dg Wrist Complete Left  02/02/2012  *RADIOLOGY REPORT*  Clinical Data: Wrist pain  LEFT WRIST - COMPLETE 3+ VIEW  Comparison: 12/21/2009  Findings: There is negative ulnar variance.  This is similar to previous exam.  Degenerative changes are noted at the distal radial ulnar joint.  No fracture or subluxation is identified.  No radiopaque foreign bodies or soft tissue calcifications  IMPRESSION:  1.  No acute findings. 2.  Negative ulnar variance   Original Report Authenticated By: Rosealee Albee, M.D.      1. Carpal tunnel  syndrome of left wrist   2. Ganglion cyst of wrist       MDM  Severe carpal tunnel syndrome as manifested by rather dramatic muscle wasting. She also has 2 ganglion cysts which do not seem to be causing any major problem currently. She has been working without keeping her wrist splint on and she is advised that she needs to keep the splint on at all times. She will be referred to the on-call hand specialist.    I personally performed the services described in this documentation, which was scribed in my presence. The recorded information has been reviewed and considered.        Dione Booze, MD 02/02/12 1328

## 2012-02-02 NOTE — ED Notes (Signed)
Pt c/o left wrist pain from carpal tunnel x 3 days; pt denies new injury

## 2012-05-13 ENCOUNTER — Encounter (HOSPITAL_COMMUNITY): Payer: Self-pay | Admitting: Cardiology

## 2012-05-13 ENCOUNTER — Emergency Department (HOSPITAL_COMMUNITY)
Admission: EM | Admit: 2012-05-13 | Discharge: 2012-05-13 | Disposition: A | Discharge status: Home / Self Care | Payer: Self-pay | Attending: Emergency Medicine | Admitting: Emergency Medicine

## 2012-05-13 DIAGNOSIS — G5600 Carpal tunnel syndrome, unspecified upper limb: Secondary | ICD-10-CM | POA: Insufficient documentation

## 2012-05-13 DIAGNOSIS — G5602 Carpal tunnel syndrome, left upper limb: Secondary | ICD-10-CM

## 2012-05-13 DIAGNOSIS — M25439 Effusion, unspecified wrist: Secondary | ICD-10-CM | POA: Insufficient documentation

## 2012-05-13 MED ORDER — IBUPROFEN 400 MG PO TABS
800.0000 mg | ORAL_TABLET | Freq: Once | ORAL | Status: AC
  Administered 2012-05-13: 800 mg via ORAL
  Filled 2012-05-13: qty 2

## 2012-05-13 MED ORDER — IBUPROFEN 800 MG PO TABS: 800.0000 mg | ORAL_TABLET | Freq: Three times a day (TID) | ORAL | Status: DC

## 2012-05-13 NOTE — ED Provider Notes (Signed)
History     CSN: [id number]  Arrival date & time 05/13/12  1155   First MD Initiated Contact with Patient 05/13/12 1231      Chief Complaint  Patient presents with  . Hand Pain    (Consider location/radiation/quality/duration/timing/severity/associated sxs/prior treatment) HPI Comments: 47 year old woman with history of carpel tunnel syndrome complaining of left wrist pain x 1 week. Pain was intensified last night after husband massaged two "cysts" on the wrist, which then resolved and were replaced by diffuse swelling around wrist. Pain is sharp and radiates from wrist up to shoulder, is rated 9/10. History shows previous cysts being drained by hand specialist 3 months ago without swelling. Denies loss of sensation, tingling, decreased ROM, or weakness. Patient say this feels like her carpal tunnel.   Patient is a 47 y.o. female presenting with hand pain.  Hand Pain Associated symptoms include arthralgias and joint swelling. Pertinent negatives include no chest pain.     Past Medical History  Diagnosis Date  . Carpal tunnel syndrome     History reviewed. No pertinent past surgical history.  History reviewed. No pertinent family history.  History  Substance Use Topics  . Smoking status: Never Smoker   . Smokeless tobacco: Not on file  . Alcohol Use: No    OB History    Grav Para Term Preterm Abortions TAB SAB Ect Mult Living                  Review of Systems  Constitutional: Positive for activity change.       Difficulty performing at job due to wrist pain.  Respiratory: Negative for shortness of breath.   Cardiovascular: Negative for chest pain.  Gastrointestinal: Negative.   Musculoskeletal: Positive for joint swelling and arthralgias.       Swelling and pain in left wrist  Skin: Negative for color change.  Neurological: Negative.   Hematological: Negative.     Allergies  Sulfonamide derivatives and Tramadol hcl  Home Medications  No current outpatient  prescriptions on file.  BP 104/76  Pulse 78  Temp 97.5 F (36.4 C) (Oral)  Resp 18  SpO2 100%  Physical Exam  Constitutional: She appears well-developed and well-nourished. No distress.  HENT:  Head: Normocephalic.  Musculoskeletal:       Left wrist: She exhibits tenderness and swelling. She exhibits no deformity.       Arms: Neurological: She is alert.    ED Course  Procedures (including critical care time)  Labs Reviewed - No data to display No results found.   1. Carpal tunnel syndrome of left wrist       MDM  Patient put in wrist splint for suspected carpal tunnel of left wrist. Patient given ibuprofen and instructed to follow up with recommended hand surgeon. No injury, obvious deformity or neurovascular compromise.       Emilia Beck, PA-C 05/13/12 1556

## 2012-05-13 NOTE — ED Notes (Signed)
Pt reports left hand swelling that started about 2 days ago. Denies any new medications or environmental triggers. States the area is slightly painful.

## 2012-05-13 NOTE — Progress Notes (Signed)
Orthopedic Tech Progress Note Patient Details:  Julie Li 06-Feb-1966 [id number] Velcro wrist splint applied to Left wrist. Tolerated well.  Ortho Devices Type of Ortho Device: Velcro wrist splint Ortho Device/Splint Location: Left Ortho Device/Splint Interventions: Application   Asia R Thompson 05/13/2012, 1:57 PM

## 2012-05-13 NOTE — ED Provider Notes (Signed)
Medical screening examination/treatment/procedure(s) were performed by non-physician practitioner and as supervising physician I was immediately available for consultation/collaboration.  Doug Sou, MD 05/13/12 1705

## 2012-05-13 NOTE — ED Notes (Addendum)
Pt states she has been having left hand pain x 1 day. Pt rates pain a 9/10 and describes it as a burning sensation with numbness and tingling that radiates up her left arm. Pt state she has carpel tunnel syndrome, has not taken anything for the pain. Slight swelling noted.

## 2012-09-21 ENCOUNTER — Encounter (HOSPITAL_COMMUNITY): Payer: Self-pay | Admitting: *Deleted

## 2012-09-21 ENCOUNTER — Emergency Department (HOSPITAL_COMMUNITY)
Admission: EM | Admit: 2012-09-21 | Discharge: 2012-09-21 | Disposition: A | Discharge status: Home / Self Care | Payer: Self-pay | Attending: Emergency Medicine | Admitting: Emergency Medicine

## 2012-09-21 DIAGNOSIS — Z888 Allergy status to other drugs, medicaments and biological substances status: Secondary | ICD-10-CM | POA: Insufficient documentation

## 2012-09-21 DIAGNOSIS — Z8739 Personal history of other diseases of the musculoskeletal system and connective tissue: Secondary | ICD-10-CM | POA: Insufficient documentation

## 2012-09-21 DIAGNOSIS — M25579 Pain in unspecified ankle and joints of unspecified foot: Secondary | ICD-10-CM | POA: Insufficient documentation

## 2012-09-21 DIAGNOSIS — M79671 Pain in right foot: Secondary | ICD-10-CM

## 2012-09-21 DIAGNOSIS — Z882 Allergy status to sulfonamides status: Secondary | ICD-10-CM | POA: Insufficient documentation

## 2012-09-21 MED ORDER — IBUPROFEN 800 MG PO TABS: 800.0000 mg | ORAL_TABLET | Freq: Three times a day (TID) | ORAL | Status: DC

## 2012-09-21 NOTE — ED Notes (Signed)
Called for triage with no answer

## 2012-09-21 NOTE — ED Provider Notes (Signed)
History    This chart was scribed for Roxy Horseman, non-physician practitioner working with Celene Kras, MD by Leone Payor, ED Scribe. This patient was seen in room TR05C/TR05C and the patient's care was started at 1515.   CSN: [id number]  Arrival date & time 09/21/12  1515   First MD Initiated Contact with Patient 09/21/12 1700      Chief Complaint  Patient presents with  . Foot Pain     The history is provided by the patient. No language interpreter was used.    HPI Comments: Julie Li is a 47 y.o. female who presents to the Emergency Department complaining of constant, gradually worsening L foot pain starting yesterday. State the pain is localized to the bottom of the foot and radiates up into her leg. She denies any injury or trauma to the affected area recently. States she was cleaning in the yard when the pain first began. Reports mainly wearing tennis shoes. Pt denies smoking and alcohol use. She states that she laces her tennis shoes very tightly.  Past Medical History  Diagnosis Date  . Carpal tunnel syndrome     History reviewed. No pertinent past surgical history.  History reviewed. No pertinent family history.  History  Substance Use Topics  . Smoking status: Never Smoker   . Smokeless tobacco: Not on file  . Alcohol Use: No    OB History   Grav Para Term Preterm Abortions TAB SAB Ect Mult Living                  Review of Systems A complete 10 system review of systems was obtained and all systems are negative except as noted in the HPI and PMH.   Allergies  Sulfonamide derivatives and Tramadol hcl  Home Medications   Current Outpatient Rx  Name  Route  Sig  Dispense  Refill  . ibuprofen (ADVIL,MOTRIN) 800 MG tablet   Oral   Take 1 tablet (800 mg total) by mouth 3 (three) times daily.   21 tablet   0     BP 107/70  Pulse 83  Temp(Src) 98.3 F (36.8 C) (Oral)  Resp 20  SpO2 100%  Physical Exam  Nursing note and vitals  reviewed. Constitutional: She is oriented to person, place, and time. She appears well-developed and well-nourished. No distress.  HENT:  Head: Normocephalic and atraumatic.  Eyes: EOM are normal.  Neck: Neck supple. No tracheal deviation present.  Cardiovascular: Normal rate.   Intact distal pulses and brisk cap refill.   Pulmonary/Chest: Effort normal. No respiratory distress.  Musculoskeletal: Normal range of motion.  L foot compression test positive pain in between the 2nd and 3rd metatarsals. No swelling, no bony abnormality or deformity. Pt is ambulatory.   Neurological: She is alert and oriented to person, place, and time.  Sensation and strength intact.   Skin: Skin is warm and dry.  Psychiatric: She has a normal mood and affect. Her behavior is normal.    ED Course  Procedures (including critical care time)  DIAGNOSTIC STUDIES: Oxygen Saturation is 100% on room air, normal by my interpretation.    COORDINATION OF CARE: 5:12 PM Discussed treatment plan with pt at bedside and pt agreed to plan.   Labs Reviewed - No data to display No results found.   1. Foot pain, right       MDM  Patient with foot pain.  No bony abnormality.  Suspect Morton's neuroma, as the patient is tender  between the second and third metatarsals. No abscesses, no redness, no signs of infection or gout. Will treat the patient with NSAIDs, and rice. Followup with the [facility] Center.     I personally performed the services described in this documentation, which was scribed in my presence. The recorded information has been reviewed and is accurate.    Roxy Horseman, PA-C 09/21/12 1743

## 2012-09-21 NOTE — ED Notes (Signed)
Reports pain to left foot since yesterday, pain to bottom of foot and now radiating up into her leg, denies injury to foot. Ambulatory at triage.

## 2012-09-24 NOTE — ED Provider Notes (Signed)
Medical screening examination/treatment/procedure(s) were performed by non-physician practitioner and as supervising physician I was immediately available for consultation/collaboration.    Celene Kras, MD 09/24/12 [phone]

## 2012-09-30 ENCOUNTER — Ambulatory Visit: Payer: Self-pay

## 2012-10-07 ENCOUNTER — Ambulatory Visit: Payer: Self-pay

## 2012-10-22 ENCOUNTER — Ambulatory Visit: Payer: Self-pay

## 2012-12-25 ENCOUNTER — Emergency Department (HOSPITAL_COMMUNITY): Payer: Self-pay

## 2012-12-25 ENCOUNTER — Emergency Department (HOSPITAL_COMMUNITY)
Admission: EM | Admit: 2012-12-25 | Discharge: 2012-12-25 | Disposition: A | Discharge status: Home / Self Care | Payer: Self-pay | Attending: Emergency Medicine | Admitting: Emergency Medicine

## 2012-12-25 ENCOUNTER — Encounter (HOSPITAL_COMMUNITY): Payer: Self-pay | Admitting: Emergency Medicine

## 2012-12-25 DIAGNOSIS — Z8669 Personal history of other diseases of the nervous system and sense organs: Secondary | ICD-10-CM | POA: Insufficient documentation

## 2012-12-25 DIAGNOSIS — R0789 Other chest pain: Secondary | ICD-10-CM | POA: Insufficient documentation

## 2012-12-25 DIAGNOSIS — R0602 Shortness of breath: Secondary | ICD-10-CM | POA: Insufficient documentation

## 2012-12-25 LAB — CBC
HCT: 36.1 % (ref 36.0–46.0)
Hemoglobin: 12.7 g/dL (ref 12.0–15.0)
MCH: 31.8 pg (ref 26.0–34.0)
MCV: 90.5 fL (ref 78.0–100.0)
RBC: 3.99 MIL/uL (ref 3.87–5.11)

## 2012-12-25 LAB — BASIC METABOLIC PANEL
BUN: 13 mg/dL (ref 6–23)
CO2: 26 mEq/L (ref 19–32)
Calcium: 9 mg/dL (ref 8.4–10.5)
Creatinine, Ser: 0.75 mg/dL (ref 0.50–1.10)
Glucose, Bld: 85 mg/dL (ref 70–99)
Sodium: 137 mEq/L (ref 135–145)

## 2012-12-25 LAB — POCT I-STAT TROPONIN I: Troponin i, poc: 0 ng/mL (ref 0.00–0.08)

## 2012-12-25 MED ORDER — KETOROLAC TROMETHAMINE 60 MG/2ML IM SOLN
60.0000 mg | Freq: Once | INTRAMUSCULAR | Status: AC
  Administered 2012-12-25: 60 mg via INTRAMUSCULAR
  Filled 2012-12-25: qty 2

## 2012-12-25 NOTE — ED Notes (Signed)
Pt was lifting heavy boxes yesterday

## 2012-12-25 NOTE — ED Notes (Signed)
Pt c/o right sided CP with SOB x 2 days; pt sts worse with movement and inspiration

## 2012-12-25 NOTE — ED Provider Notes (Signed)
CSN: [id number]     Arrival date & time 12/25/12  1808 History   First MD Initiated Contact with Patient 12/25/12 1816     Chief Complaint  Patient presents with  . Chest Pain   (Consider location/radiation/quality/duration/timing/severity/associated sxs/prior Treatment) HPI Comments: Patient presents with one-day history of right-sided chest pain that is worse with movement of her torso and worse with movement of her right arm. Is associated with some shortness of breath. She denies any and exertion. No cough, fever, nausea or diaphoresis. Pain is better with rest. It is worse with movement. Denies any leg pain or swelling. No cardiac history. Denies taking any medications. No rash. Pain is somewhat better with Tylenol.  The history is provided by the patient.    Past Medical History  Diagnosis Date  . Carpal tunnel syndrome    History reviewed. No pertinent past surgical history. History reviewed. No pertinent family history. History  Substance Use Topics  . Smoking status: Never Smoker   . Smokeless tobacco: Not on file  . Alcohol Use: No   OB History   Grav Para Term Preterm Abortions TAB SAB Ect Mult Living                 Review of Systems  Constitutional: Negative for fever, activity change and appetite change.  HENT: Negative for congestion and rhinorrhea.   Respiratory: Positive for chest tightness and shortness of breath. Negative for cough.   Cardiovascular: Positive for chest pain.  Gastrointestinal: Negative for nausea, vomiting and abdominal pain.  Genitourinary: Negative for dysuria, hematuria, vaginal bleeding and vaginal discharge.  Musculoskeletal: Negative for back pain.  Skin: Negative for rash.  Neurological: Negative for dizziness, weakness and headaches.  A complete 10 system review of systems was obtained and all systems are negative except as noted in the HPI and PMH.    Allergies  Sulfonamide derivatives and Tramadol hcl  Home Medications    Current Outpatient Rx  Name  Route  Sig  Dispense  Refill  . ibuprofen (ADVIL,MOTRIN) 200 MG tablet   Oral   Take 200 mg by mouth every 6 (six) hours as needed for pain.          BP 107/85  Pulse 75  Temp(Src) 98.5 F (36.9 C) (Oral)  Resp 16  Ht 5\' 2"  (1.575 m)  Wt 105 lb (47.628 kg)  BMI 19.2 kg/m2  SpO2 100%  LMP 12/19/2012 Physical Exam  Constitutional: She is oriented to person, place, and time. She appears well-developed and well-nourished. No distress.  HENT:  Head: Normocephalic and atraumatic.  Mouth/Throat: Oropharynx is clear and moist. No oropharyngeal exudate.  Eyes: Conjunctivae and EOM are normal. Pupils are equal, round, and reactive to light.  Neck: Normal range of motion. Neck supple.  Cardiovascular: Normal rate, regular rhythm and normal heart sounds.   Pulmonary/Chest: Effort normal and breath sounds normal. No respiratory distress. She exhibits tenderness.  No rash.   TTP R chest wall under breast  Abdominal: Soft.  Musculoskeletal: Normal range of motion. She exhibits no edema and no tenderness.  CP worse with movement of R arm  Neurological: She is alert and oriented to person, place, and time. No cranial nerve deficit. She exhibits normal muscle tone. Coordination normal.  Skin: Skin is warm.    ED Course  Procedures (including critical care time) Labs Review Labs Reviewed  CBC  BASIC METABOLIC PANEL  D-DIMER, QUANTITATIVE  TROPONIN I  POCT I-STAT TROPONIN I  POCT I-STAT TROPONIN  I   Imaging Review Dg Chest 2 View  12/25/2012   *RADIOLOGY REPORT*  Clinical Data: Chest pain  CHEST - 2 VIEW  Comparison: 07/25/2010  Findings: The cardiac shadow is stable.  The lungs are clear bilaterally.  No acute bony abnormality is noted.  IMPRESSION: No acute abnormalities seen.   Original Report Authenticated By: Alcide Clever, M.D.    MDM   1. Atypical chest pain    Right-sided chest pain that is intermittent for the past day, worse with movement  and worse with movement of the right arm. Atypical for ACS. EKG shows nonspecific ST changes  Troponin negative. D-dimer negative. Pain is improved with Toradol. Chest x-ray shows no pneumothorax.  Low suspicion for ACS or PE. Return precautions discussed with patient. She is advised to monitor her skin for a development of a rash as this may represent early shingles. Doubt ACS or PE. Return precautions discussed.    Date: 12/25/2012  Rate: 88  Rhythm: normal sinus rhythm  QRS Axis: normal  Intervals: normal  ST/T Wave abnormalities: nonspecific ST/T changes  Conduction Disutrbances:none  Narrative Interpretation:   Old EKG Reviewed: unchanged    Glynn Octave, MD 12/25/12 2333

## 2013-02-19 ENCOUNTER — Other Ambulatory Visit: Payer: Self-pay

## 2013-12-17 ENCOUNTER — Emergency Department (HOSPITAL_COMMUNITY)
Admission: EM | Admit: 2013-12-17 | Discharge: 2013-12-17 | Disposition: A | Discharge status: Home / Self Care | Payer: Self-pay | Attending: Emergency Medicine | Admitting: Emergency Medicine

## 2013-12-17 ENCOUNTER — Encounter (HOSPITAL_COMMUNITY): Payer: Self-pay | Admitting: Emergency Medicine

## 2013-12-17 DIAGNOSIS — R509 Fever, unspecified: Secondary | ICD-10-CM | POA: Insufficient documentation

## 2013-12-17 DIAGNOSIS — Z79899 Other long term (current) drug therapy: Secondary | ICD-10-CM | POA: Insufficient documentation

## 2013-12-17 DIAGNOSIS — J069 Acute upper respiratory infection, unspecified: Secondary | ICD-10-CM | POA: Insufficient documentation

## 2013-12-17 DIAGNOSIS — Z8669 Personal history of other diseases of the nervous system and sense organs: Secondary | ICD-10-CM | POA: Insufficient documentation

## 2013-12-17 MED ORDER — OXYMETAZOLINE HCL 0.05 % NA SOLN
1.0000 | Freq: Once | NASAL | Status: AC
  Administered 2013-12-17: 1 via NASAL
  Filled 2013-12-17: qty 15

## 2013-12-17 MED ORDER — CETIRIZINE HCL 10 MG PO TABS: 10.0000 mg | ORAL_TABLET | Freq: Every day | ORAL | Status: DC

## 2013-12-17 NOTE — ED Provider Notes (Signed)
CSN: [id number]     Arrival date & time 12/17/13  1957 History   First MD Initiated Contact with Patient 12/17/13 2135     Chief Complaint  Patient presents with  . Fever     (Consider location/radiation/quality/duration/timing/severity/associated sxs/prior Treatment) HPI Comments: 48 year old female, no significant past medical history who presents with one day of nasal congestion, 3 days of aching to her bilateral thighs, nasal congestion, subjective fevers. Denies coughing, sore throat, vomiting, diarrhea, abdominal pain or rashes or tick bites. Nothing makes better or worse, no medications given other than TheraFlu given prior to arrival  Patient is a 48 y.o. female presenting with fever. The history is provided by the patient.  Fever   Past Medical History  Diagnosis Date  . Carpal tunnel syndrome    History reviewed. No pertinent past surgical history. History reviewed. No pertinent family history. History  Substance Use Topics  . Smoking status: Never Smoker   . Smokeless tobacco: Not on file  . Alcohol Use: No   OB History   Grav Para Term Preterm Abortions TAB SAB Ect Mult Living                 Review of Systems  Constitutional: Positive for fever.  All other systems reviewed and are negative.     Allergies  Sulfonamide derivatives and Tramadol hcl  Home Medications   Prior to Admission medications   Medication Sig Start Date End Date Taking? Authorizing Provider  Phenylephrine-Pheniramine-DM [facility] COLD & COUGH PO) Take 1 tablet by mouth daily.   Yes Historical Provider, MD  cetirizine (ZYRTEC ALLERGY) 10 MG tablet Take 1 tablet (10 mg total) by mouth daily. 12/17/13   Vida Roller, MD   BP 107/68  Pulse 99  Temp(Src) 98.9 F (37.2 C) (Oral)  Resp 14  SpO2 99%  LMP 10/16/2013 Physical Exam  Nursing note and vitals reviewed. Constitutional: She appears well-developed and well-nourished. No distress.  HENT:  Head: Normocephalic and atraumatic.   Mouth/Throat: Oropharynx is clear and moist. No oropharyngeal exudate.  Nasal passages with swelling of the turbinates, clear rhinorrhea present in the bilateral nasal passages, posterior pharynx clear without drainage swelling redness asymmetry exudate or erythema  Eyes: Conjunctivae and EOM are normal. Pupils are equal, round, and reactive to light. Right eye exhibits no discharge. Left eye exhibits no discharge. No scleral icterus.  Neck: Normal range of motion. Neck supple. No JVD present. No thyromegaly present.  Soft supple neck with no lymphadenopathy, no torticollis  Cardiovascular: Normal rate, regular rhythm, normal heart sounds and intact distal pulses.  Exam reveals no gallop and no friction rub.   No murmur heard. Pulmonary/Chest: Effort normal and breath sounds normal. No respiratory distress. She has no wheezes. She has no rales.  Abdominal: Soft. Bowel sounds are normal. She exhibits no distension and no mass. There is no tenderness.  Musculoskeletal: Normal range of motion. She exhibits no edema and no tenderness.  Lymphadenopathy:    She has no cervical adenopathy.  Neurological: She is alert. Coordination normal.  Skin: Skin is warm and dry. No rash noted. No erythema.  Psychiatric: She has a normal mood and affect. Her behavior is normal.    ED Course  Procedures (including critical care time) Labs Review Labs Reviewed - No data to display  Imaging Review No results found.    MDM   Final diagnoses:  URI (upper respiratory infection)    There is no signs of significant infection, the patient has  likely rhinitis, upper respiratory infection, has normal lung sounds, normal vital signs, no fever, no signs of tick-related illness, can be discharged home with medications as below   Meds given in ED:  Medications  oxymetazoline (AFRIN) 0.05 % nasal spray 1 spray (not administered)    New Prescriptions   CETIRIZINE (ZYRTEC ALLERGY) 10 MG TABLET    Take 1  tablet (10 mg total) by mouth daily.        Vida Roller, MD 12/18/13 [id number]

## 2013-12-17 NOTE — ED Notes (Signed)
Pt complains of body aches and a fever for three days

## 2013-12-17 NOTE — Discharge Instructions (Signed)
Please call your doctor for a followup appointment within 24-48 hours. When you talk to your doctor please let them know that you were seen in the emergency department and have them acquire all of your records so that they can discuss the findings with you and formulate a treatment plan to fully care for your new and ongoing problems.   You may use the nasal spray 1 spray in each nostril every 12 hours as needed for congestion.  Tylenol or motrin for fever or pain

## 2015-02-27 ENCOUNTER — Emergency Department (HOSPITAL_COMMUNITY): Payer: Self-pay

## 2015-02-27 ENCOUNTER — Encounter (HOSPITAL_COMMUNITY): Payer: Self-pay | Admitting: Emergency Medicine

## 2015-02-27 ENCOUNTER — Emergency Department (HOSPITAL_COMMUNITY)
Admission: EM | Admit: 2015-02-27 | Discharge: 2015-02-27 | Disposition: A | Discharge status: Home / Self Care | Payer: Self-pay | Attending: Emergency Medicine | Admitting: Emergency Medicine

## 2015-02-27 DIAGNOSIS — Z79899 Other long term (current) drug therapy: Secondary | ICD-10-CM | POA: Insufficient documentation

## 2015-02-27 DIAGNOSIS — Z8669 Personal history of other diseases of the nervous system and sense organs: Secondary | ICD-10-CM | POA: Insufficient documentation

## 2015-02-27 DIAGNOSIS — Z3202 Encounter for pregnancy test, result negative: Secondary | ICD-10-CM | POA: Insufficient documentation

## 2015-02-27 DIAGNOSIS — B349 Viral infection, unspecified: Secondary | ICD-10-CM | POA: Insufficient documentation

## 2015-02-27 LAB — PREGNANCY, URINE: Preg Test, Ur: NEGATIVE

## 2015-02-27 MED ORDER — IBUPROFEN 800 MG PO TABS
800.0000 mg | ORAL_TABLET | Freq: Once | ORAL | Status: AC
  Administered 2015-02-27: 800 mg via ORAL
  Filled 2015-02-27: qty 1

## 2015-02-27 MED ORDER — ACETAMINOPHEN 500 MG PO TABS
1000.0000 mg | ORAL_TABLET | Freq: Once | ORAL | Status: AC
  Administered 2015-02-27: 1000 mg via ORAL
  Filled 2015-02-27: qty 2

## 2015-02-27 MED ORDER — BENZONATATE 100 MG PO CAPS: 100.0000 mg | ORAL_CAPSULE | Freq: Three times a day (TID) | ORAL | Status: DC

## 2015-02-27 MED ORDER — IBUPROFEN 800 MG PO TABS: 800.0000 mg | ORAL_TABLET | Freq: Three times a day (TID) | ORAL | Status: DC

## 2015-02-27 NOTE — ED Notes (Addendum)
Pt from home c/o bodyaches, cough, and general malaise x few days. Non-productive cough.

## 2015-02-27 NOTE — ED Provider Notes (Signed)
CSN: [id number]     Arrival date & time 02/27/15  0247 History  By signing my name below, I, Freida Busman, attest that this documentation has been prepared under the direction and in the presence of Kinga Cassar, MD . Electronically Signed: Freida Busman, Scribe. 02/27/2015. 3:50 AM.  Chief Complaint  Patient presents with  . bodyaches   . Cough    Patient is a 49 y.o. female presenting with cough. The history is provided by the patient. No language interpreter was used.  Cough Cough characteristics:  Dry Severity:  Mild Onset quality:  Gradual Duration:  1 day Timing:  Sporadic Progression:  Unchanged Chronicity:  New Smoker: no   Context: not exposure to allergens, not sick contacts, not smoke exposure and not with activity   Relieved by:  Nothing Ineffective treatments:  None tried Associated symptoms: myalgias   Associated symptoms: no chest pain, no chills, no diaphoresis, no ear fullness, no ear pain, no eye discharge, no fever, no rash, no rhinorrhea, no shortness of breath, no sinus congestion, no sore throat, no weight loss and no wheezing   Risk factors: no recent travel      HPI Comments:  Julie Li is a 49 y.o. female who presents to the Emergency Department complaining of generalized body aches which began yesterday with associated dry cough. She denies congestion, fever, nausea, vomiting, and diarrhea. She also denies sick contacts at home but notes she lives with children that attend public school. No flu shot received this season. No alleviating factors noted.   Past Medical History  Diagnosis Date  . Carpal tunnel syndrome    History reviewed. No pertinent past surgical history. No family history on file. Social History  Substance Use Topics  . Smoking status: Never Smoker   . Smokeless tobacco: None  . Alcohol Use: No   OB History    No data available     Review of Systems  Constitutional: Negative for fever, chills, weight loss and  diaphoresis.  HENT: Negative for ear pain, rhinorrhea and sore throat.   Eyes: Negative for discharge.  Respiratory: Positive for cough. Negative for shortness of breath and wheezing.   Cardiovascular: Negative for chest pain.  Musculoskeletal: Positive for myalgias.  Skin: Negative for rash.  All other systems reviewed and are negative.   Allergies  Sulfonamide derivatives and Tramadol hcl  Home Medications   Prior to Admission medications   Medication Sig Start Date End Date Taking? Authorizing Provider  cetirizine (ZYRTEC ALLERGY) 10 MG tablet Take 1 tablet (10 mg total) by mouth daily. 12/17/13   Eber Hong, MD  Phenylephrine-Pheniramine-DM [facility] COLD & COUGH PO) Take 1 tablet by mouth daily.    Historical Provider, MD   BP 113/73 mmHg  Pulse 100  Temp(Src) 99.1 F (37.3 C) (Oral)  Resp 18  SpO2 99% Physical Exam  Constitutional: She is oriented to person, place, and time. She appears well-developed and well-nourished. No distress.  HENT:  Head: Normocephalic and atraumatic.  Mouth/Throat: Oropharynx is clear and moist. No oropharyngeal exudate.  Moist mucous membranes  Clear colorless postnasal drip  Eyes: Conjunctivae are normal. Pupils are equal, round, and reactive to light.  Neck: Normal range of motion. Neck supple. No JVD present. Carotid bruit is not present.  Trachea midline  Cardiovascular: Normal rate, regular rhythm and normal heart sounds.   Pulmonary/Chest: Effort normal and breath sounds normal. No stridor. No respiratory distress. She has no wheezes. She has no rales.  Abdominal: Soft.  Bowel sounds are normal. She exhibits no distension. There is no tenderness. There is no rebound and no guarding.  Musculoskeletal: She exhibits no edema or tenderness.  Lymphadenopathy:    She has no cervical adenopathy.  Neurological: She is alert and oriented to person, place, and time. She has normal reflexes.  Skin: Skin is warm and dry.  Psychiatric: She has a  normal mood and affect. Her behavior is normal.  Nursing note and vitals reviewed.   ED Course  Procedures   DIAGNOSTIC STUDIES:  Oxygen Saturation is 99% on RA, normal by my interpretation.    COORDINATION OF CARE:  3:44 AM Discussed treatment plan with pt at bedside and pt agreed to plan.  Labs Review Labs Reviewed  POC URINE PREG, ED    Imaging Review Dg Chest 2 View  02/27/2015  CLINICAL DATA:  Body aches EXAM: CHEST  2 VIEW COMPARISON:  12/25/2012 FINDINGS: Normal heart size and mediastinal contours. No acute infiltrate or edema. No effusion or pneumothorax. No acute osseous findings. IMPRESSION: Negative chest. Electronically Signed   By: Marnee SpringJonathon  Watts M.D.   On: 02/27/2015 03:30   I have personally reviewed and evaluated these images and lab results as part of my medical decision-making.   EKG Interpretation None      MDM   Final diagnoses:  None    Viral infection.  No fever, highly doubt flu.  Lungs clear will treat symptomatically  I personally performed the services described in this documentation, which was scribed in my presence. The recorded information has been reviewed and is accurate.      Cy BlamerApril Dajae Kizer, MD 02/27/15 [id number]

## 2015-02-27 NOTE — Discharge Instructions (Signed)
Viral Infections °A viral infection can be caused by different types of viruses. Most viral infections are not serious and resolve on their own. However, some infections may cause severe symptoms and may lead to further complications. °SYMPTOMS °Viruses can frequently cause: °· Minor sore throat. °· Aches and pains. °· Headaches. °· Runny nose. °· Different types of rashes. °· Watery eyes. °· Tiredness. °· Cough. °· Loss of appetite. °· Gastrointestinal infections, resulting in nausea, vomiting, and diarrhea. °These symptoms do not respond to antibiotics because the infection is not caused by bacteria. However, you might catch a bacterial infection following the viral infection. This is sometimes called a "superinfection." Symptoms of such a bacterial infection may include: °· Worsening sore throat with pus and difficulty swallowing. °· Swollen neck glands. °· Chills and a high or persistent fever. °· Severe headache. °· Tenderness over the sinuses. °· Persistent overall ill feeling (malaise), muscle aches, and tiredness (fatigue). °· Persistent cough. °· Yellow, green, or brown mucus production with coughing. °HOME CARE INSTRUCTIONS  °· Only take over-the-counter or prescription medicines for pain, discomfort, diarrhea, or fever as directed by your caregiver. °· Drink enough water and fluids to keep your urine clear or pale yellow. Sports drinks can provide valuable electrolytes, sugars, and hydration. °· Get plenty of rest and maintain proper nutrition. Soups and broths with crackers or rice are fine. °SEEK IMMEDIATE MEDICAL CARE IF:  °· You have severe headaches, shortness of breath, chest pain, neck pain, or an unusual rash. °· You have uncontrolled vomiting, diarrhea, or you are unable to keep down fluids. °· You or your child has an oral temperature above 102° F (38.9° C), not controlled by medicine. °· Your baby is older than 3 months with a rectal temperature of 102° F (38.9° C) or higher. °· Your baby is 3  months old or younger with a rectal temperature of 100.4° F (38° C) or higher. °MAKE SURE YOU:  °· Understand these instructions. °· Will watch your condition. °· Will get help right away if you are not doing well or get worse. °  °This information is not intended to replace advice given to you by your health care provider. Make sure you discuss any questions you have with your health care provider. °  °Document Released: 01/10/2005 Document Revised: 06/25/2011 Document Reviewed: 09/08/2014 °Elsevier Interactive Patient Education ©2016 [employer] ° °

## 2016-03-19 ENCOUNTER — Encounter (HOSPITAL_COMMUNITY): Payer: Self-pay | Admitting: Emergency Medicine

## 2016-03-19 ENCOUNTER — Emergency Department (HOSPITAL_COMMUNITY)
Admission: EM | Admit: 2016-03-19 | Discharge: 2016-03-19 | Disposition: A | Discharge status: Home / Self Care | Payer: Self-pay | Attending: Emergency Medicine | Admitting: Emergency Medicine

## 2016-03-19 DIAGNOSIS — Z79899 Other long term (current) drug therapy: Secondary | ICD-10-CM | POA: Insufficient documentation

## 2016-03-19 DIAGNOSIS — B349 Viral infection, unspecified: Secondary | ICD-10-CM | POA: Insufficient documentation

## 2016-03-19 MED ORDER — ACETAMINOPHEN 325 MG PO TABS
650.0000 mg | ORAL_TABLET | Freq: Once | ORAL | Status: AC
  Administered 2016-03-19: 650 mg via ORAL
  Filled 2016-03-19: qty 2

## 2016-03-19 NOTE — Discharge Instructions (Signed)
Use ibuprofen or tylenol for fever, body aches. Robitussin DM for cough.

## 2016-03-19 NOTE — ED Triage Notes (Signed)
Pt c/o body aches, subjective fever, productive cough with white mucus onset today. No n/v/diarrhea.

## 2016-03-19 NOTE — ED Provider Notes (Signed)
[facility]-EMERGENCY DEPT Provider Note   CSN: [id number] Arrival date & time: 03/19/16  1739  By signing my name below, I, Emmanuella Mensah, attest that this documentation has been prepared under the direction and in the presence of Felicie Mornavid Jakhia Buxton, NP. Electronically Signed: Angelene GiovanniEmmanuella Mensah, ED Scribe. 03/19/16. 6:13 PM.   History   Chief Complaint Chief Complaint  Patient presents with  . Cough  . Generalized Body Aches    HPI Comments: Julie Li is a 50 y.o. female who presents to the Emergency Department complaining of gradually worsening moderate generalized body aches onset 3 hours ago. She reports associated persistent productive cough with clear sputum and mild nasal congestion with clear rhinorrhea. No alleviating factors noted. Pt has not tried any medications PTA. She has an allergy to Sulfonamide derivatives and Tramadol. She states that she did not receive the flu vaccine this season. She denies any known sick contacts but states that she works at [employer]. She also denies any known fevers, chills, nausea, vomiting, diarrhea, shortness of breath, wheezing, or any other symptoms.   The history is provided by the patient. No language interpreter was used.    Past Medical History:  Diagnosis Date  . Carpal tunnel syndrome     Patient Active Problem List   Diagnosis Date Noted  . Carpal tunnel syndrome   . 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 UNDERWEIGHT 11/05/2007  . DENTAL CARIES 06/02/2007    History reviewed. No pertinent surgical history.  OB History    No data available       Home Medications    Prior to Admission medications   Medication Sig Start Date End Date Taking? Authorizing Provider  benzonatate (TESSALON) 100 MG capsule Take 1 capsule (100 mg total) by mouth every 8 (eight) hours. 02/27/15   April Palumbo, MD  ibuprofen (ADVIL,MOTRIN) 800 MG tablet Take 1 tablet (800 mg total) by mouth 3 (three) times daily. 02/27/15   Cy BlamerApril Palumbo, MD     Family History History reviewed. No pertinent family history.  Social History Social History  Substance Use Topics  . Smoking status: Never Smoker  . Smokeless tobacco: Not on file  . Alcohol use No     Allergies   Sulfonamide derivatives and Tramadol hcl   Review of Systems Review of Systems  Constitutional: Negative for chills and fever.  HENT: Positive for congestion and rhinorrhea.   Respiratory: Positive for cough. Negative for shortness of breath and wheezing.   Gastrointestinal: Negative for diarrhea, nausea and vomiting.  Musculoskeletal: Positive for myalgias.  All other systems reviewed and are negative.    Physical Exam Updated Vital Signs BP 118/83 (BP Location: Left Arm)   Pulse 113   Temp 99.5 F (37.5 C) (Oral)   Resp 16   LMP 10/16/2013   SpO2 100%   Physical Exam  Constitutional: She is oriented to person, place, and time. She appears well-developed and well-nourished. No distress.  HENT:  Head: Normocephalic and atraumatic.  Eyes: Conjunctivae and EOM are normal.  Neck: Neck supple. No tracheal deviation present.  Cardiovascular: Normal rate.   Pulmonary/Chest: Effort normal. No respiratory distress.  Abdominal: Soft.  Musculoskeletal: Normal range of motion.  Neurological: She is alert and oriented to person, place, and time.  Skin: Skin is warm and dry.  Psychiatric: She has a normal mood and affect. Her behavior is normal.  Nursing note and vitals reviewed.    ED Treatments / Results  DIAGNOSTIC STUDIES: Oxygen Saturation is 100% on RA,  normal by my interpretation.    COORDINATION OF CARE: 6:08 PM- Pt advised of plan for treatment and pt agrees. Advised symptomatic treatment with Ibuprofen and Robitussin DM. Return precautions discussed with persistent high fevers.   Labs (all labs ordered are listed, but only abnormal results are displayed) Labs Reviewed - No data to display  EKG  EKG Interpretation None        Radiology No results found.  Procedures Procedures (including critical care time)  Medications Ordered in ED Medications - No data to display   Initial Impression / Assessment and Plan / ED Course  Felicie Mornavid Cristina Ceniceros, NP has reviewed the triage vital signs and the nursing notes.  Pertinent labs & imaging results that were available during my care of the patient were reviewed by me and considered in my medical decision making (see chart for details).  Clinical Course      Patients symptoms are consistent with URI, likely viral etiology. Discussed that antibiotics are not indicated for viral infections. Pt will be discharged with symptomatic treatment. Verbalizes understanding and is agreeable with plan. Pt is hemodynamically stable & in NAD prior to dc.  Final Clinical Impressions(s) / ED Diagnoses   Final diagnoses:  Viral syndrome    New Prescriptions New Prescriptions   No medications on file   I personally performed the services described in this documentation, which was scribed in my presence. The recorded information has been reviewed and is accurate.    Felicie Mornavid Denarius Sesler, NP 03/19/16 1824    Shaune Pollackameron Isaacs, MD 03/20/16 [id number]

## 2016-07-03 ENCOUNTER — Emergency Department (HOSPITAL_COMMUNITY)
Admission: EM | Admit: 2016-07-03 | Discharge: 2016-07-03 | Disposition: A | Discharge status: Home / Self Care | Payer: Self-pay | Attending: Emergency Medicine | Admitting: Emergency Medicine

## 2016-07-03 ENCOUNTER — Encounter (HOSPITAL_COMMUNITY): Payer: Self-pay

## 2016-07-03 DIAGNOSIS — K0889 Other specified disorders of teeth and supporting structures: Secondary | ICD-10-CM | POA: Insufficient documentation

## 2016-07-03 MED ORDER — AMOXICILLIN 500 MG PO CAPS: 500.0000 mg | ORAL_CAPSULE | Freq: Three times a day (TID) | ORAL | 0 refills | Status: DC

## 2016-07-03 MED ORDER — HYDROCODONE-ACETAMINOPHEN 5-325 MG PO TABS
1.0000 | ORAL_TABLET | Freq: Once | ORAL | Status: AC
  Administered 2016-07-03: 1 via ORAL
  Filled 2016-07-03: qty 1

## 2016-07-03 NOTE — Discharge Instructions (Signed)
Please take the antibiotics as prescribed. You may take Advil and tylenol for pain. Please make sure you are alternating these medications of 4-6 hours. You may also use over the counter orajel to numb the area. You need to find a dentist as soon as possible to have it extracted. Please return to the ED if you have difficulty breathing/swallowing, worsening swelling, or fevers.

## 2016-07-03 NOTE — ED Provider Notes (Signed)
[facility]-EMERGENCY DEPT Provider Note   CSN: [id number] Arrival date & time: 07/03/16  4098  By signing my name below, I, Julie Li, attest that this documentation has been prepared under the direction and in the presence of Rise Mu, PA-C. Electronically Signed: Leone Payor, Scribe. 07/03/16. 10:18 AM.  History   Chief Complaint Chief Complaint  Patient presents with  . Dental Pain    The history is provided by the patient. No language interpreter was used.     HPI Comments: Julie Li is a 51 y.o. female who presents to the Emergency Department complaining of constant, gradually worsening left upper dental pain that began 2 days ago. She has tried ibuprofen without significant relief. She denies having dental follow up. She denies fever, nausea, vomiting, facial swelling, difficulty swallowing or breathing.   Past Medical History:  Diagnosis Date  . Carpal tunnel syndrome     Patient Active Problem List   Diagnosis Date Noted  . Carpal tunnel syndrome   . 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 UNDERWEIGHT 11/05/2007  . DENTAL CARIES 06/02/2007    History reviewed. No pertinent surgical history.  OB History    No data available       Home Medications    Prior to Admission medications   Medication Sig Start Date End Date Taking? Authorizing Provider  benzonatate (TESSALON) 100 MG capsule Take 1 capsule (100 mg total) by mouth every 8 (eight) hours. 02/27/15   April Palumbo, MD  ibuprofen (ADVIL,MOTRIN) 800 MG tablet Take 1 tablet (800 mg total) by mouth 3 (three) times daily. 02/27/15   April Palumbo, MD    Family History Family History  Problem Relation Age of Onset  . Hypertension Mother     Social History Social History  Substance Use Topics  . Smoking status: Never Smoker  . Smokeless tobacco: Never Used  . Alcohol use No     Allergies   Sulfonamide derivatives and Tramadol hcl   Review of Systems Review of Systems    Constitutional: Negative for fever.  HENT: Positive for dental problem. Negative for trouble swallowing.   Gastrointestinal: Positive for nausea and vomiting.     Physical Exam Updated Vital Signs BP 125/82 (BP Location: Right Arm)   Pulse 92   Temp 98 F (36.7 C) (Oral)   Resp 17   Ht 5\' 2"  (1.575 m)   Wt 120 lb (54.4 kg)   LMP 10/16/2013   SpO2 100%   BMI 21.95 kg/m   Physical Exam  Constitutional: She is oriented to person, place, and time. She appears well-developed and well-nourished.  HENT:  Head: Normocephalic and atraumatic.  Mouth/Throat: Oropharynx is clear and moist. Abnormal dentition. Dental caries present. No dental abscesses.  Poor dentition throughout with several missing teeth and remaining teeth with severe dental caries. Left upper 3rd molar with erythema and mild gingival edema. No purulent discharge. No gross abscess noted. Looks like her tooth is impacted. No sublingual or submandibular swelling. No facial swelling. Oropharynx clear. Managing secretions and maintaining airway. Speaking in full sentences.   Neck: Normal range of motion. Neck supple.  Cardiovascular: Normal rate.   Pulmonary/Chest: Effort normal.  Neurological: She is alert and oriented to person, place, and time.  Skin: Skin is warm and dry.  Psychiatric: She has a normal mood and affect.  Nursing note and vitals reviewed.    ED Treatments / Results  DIAGNOSTIC STUDIES: Oxygen Saturation is 100% on RA, normal by my interpretation.  COORDINATION OF CARE: 10:21 AM Discussed treatment plan with pt at bedside and pt agreed to plan.   Labs (all labs ordered are listed, but only abnormal results are displayed) Labs Reviewed - No data to display  EKG  EKG Interpretation None       Radiology No results found.  Procedures Procedures (including critical care time)  Medications Ordered in ED Medications  HYDROcodone-acetaminophen (NORCO/VICODIN) 5-325 MG per tablet 1 tablet  (not administered)     Initial Impression / Assessment and Plan / ED Course  I have reviewed the triage vital signs and the nursing notes.  Pertinent labs & imaging results that were available during my care of the patient were reviewed by me and considered in my medical decision making (see chart for details).     Patient with dentalgia.  No abscess requiring immediate incision and drainage.  Exam not concerning for Ludwig's angina or pharyngeal abscess.  Will treat with antibiotics and otc pain medications. Pt instructed to follow-up with dentist.  Discussed return precautions. Pt safe for discharge.   Final Clinical Impressions(s) / ED Diagnoses   Final diagnoses:  Pain, dental    New Prescriptions New Prescriptions   AMOXICILLIN (AMOXIL) 500 MG CAPSULE    Take 1 capsule (500 mg total) by mouth 3 (three) times daily.   I personally performed the services described in this documentation, which was scribed in my presence. The recorded information has been reviewed and is accurate.    Rise MuKenneth T Milus Fritze, PA-C 07/03/16 1035    Gwyneth SproutWhitney Plunkett, MD 07/04/16 1622

## 2016-07-03 NOTE — ED Triage Notes (Signed)
Patient c/o left upper dental pain and states the tooth has a crack in the tooth.

## 2016-07-03 NOTE — ED Notes (Signed)
Bed: WTR5 Expected date:  Expected time:  Means of arrival:  Comments: 

## 2017-02-07 ENCOUNTER — Encounter (HOSPITAL_COMMUNITY): Payer: Self-pay

## 2017-02-07 ENCOUNTER — Emergency Department (HOSPITAL_COMMUNITY)
Admission: EM | Admit: 2017-02-07 | Discharge: 2017-02-07 | Disposition: A | Discharge status: Home / Self Care | Payer: Self-pay | Attending: Emergency Medicine | Admitting: Emergency Medicine

## 2017-02-07 ENCOUNTER — Emergency Department (HOSPITAL_COMMUNITY): Payer: Self-pay

## 2017-02-07 DIAGNOSIS — Z5321 Procedure and treatment not carried out due to patient leaving prior to being seen by health care provider: Secondary | ICD-10-CM | POA: Insufficient documentation

## 2017-02-07 LAB — BASIC METABOLIC PANEL
ANION GAP: 8 (ref 5–15)
BUN: 14 mg/dL (ref 6–20)
CALCIUM: 9.1 mg/dL (ref 8.9–10.3)
CO2: 25 mmol/L (ref 22–32)
CREATININE: 0.85 mg/dL (ref 0.44–1.00)
Chloride: 105 mmol/L (ref 101–111)
GFR calc Af Amer: >60 mL/min (ref >60)
GLUCOSE: 78 mg/dL (ref 65–99)
Potassium: 4.5 mmol/L (ref 3.5–5.1)
Sodium: 138 mmol/L (ref 135–145)

## 2017-02-07 LAB — CBC
HCT: 41.4 % (ref 36.0–46.0)
HEMOGLOBIN: 13.8 g/dL (ref 12.0–15.0)
MCH: 30.7 pg (ref 26.0–34.0)
MCHC: 33.3 g/dL (ref 30.0–36.0)
MCV: 92.2 fL (ref 78.0–100.0)
PLATELETS: 340 10*3/uL (ref 150–400)
RBC: 4.49 MIL/uL (ref 3.87–5.11)
RDW: 13.4 % (ref 11.5–15.5)
WBC: 4.8 10*3/uL (ref 4.0–10.5)

## 2017-02-07 LAB — I-STAT TROPONIN, ED: Troponin i, poc: 0 ng/mL (ref 0.00–0.08)

## 2017-02-07 NOTE — ED Triage Notes (Signed)
Patient complains of SSCP x 1 day, describes as sharp. No radiation. Describes as intermittent, alert and oriented

## 2017-02-07 NOTE — ED Notes (Signed)
No answer x3

## 2017-02-07 NOTE — ED Notes (Signed)
No answer x2 for room 

## 2017-02-07 NOTE — ED Notes (Addendum)
Pt. Was called several times at 16:48 and was looked for around the waiting room. No reply.

## 2017-11-17 ENCOUNTER — Encounter (HOSPITAL_COMMUNITY): Payer: Self-pay | Admitting: *Deleted

## 2017-11-17 ENCOUNTER — Other Ambulatory Visit: Payer: Self-pay

## 2017-11-17 ENCOUNTER — Emergency Department (HOSPITAL_COMMUNITY)
Admission: EM | Admit: 2017-11-17 | Discharge: 2017-11-17 | Disposition: A | Discharge status: Home / Self Care | Payer: Self-pay | Attending: Emergency Medicine | Admitting: Emergency Medicine

## 2017-11-17 DIAGNOSIS — K0889 Other specified disorders of teeth and supporting structures: Secondary | ICD-10-CM | POA: Insufficient documentation

## 2017-11-17 MED ORDER — OXYCODONE-ACETAMINOPHEN 5-325 MG PO TABS
1.0000 | ORAL_TABLET | Freq: Once | ORAL | Status: AC
  Administered 2017-11-17: 1 via ORAL
  Filled 2017-11-17: qty 1

## 2017-11-17 MED ORDER — PENICILLIN V POTASSIUM 500 MG PO TABS: 500.0000 mg | ORAL_TABLET | Freq: Four times a day (QID) | ORAL | 0 refills | Status: AC

## 2017-11-17 MED ORDER — PENICILLIN V POTASSIUM 500 MG PO TABS
500.0000 mg | ORAL_TABLET | Freq: Once | ORAL | Status: AC
  Administered 2017-11-17: 500 mg via ORAL
  Filled 2017-11-17: qty 1

## 2017-11-17 MED ORDER — MELOXICAM 7.5 MG PO TABS: 7.5000 mg | ORAL_TABLET | Freq: Every day | ORAL | 0 refills | Status: DC

## 2017-11-17 NOTE — ED Triage Notes (Signed)
Toothache since Friday, top left molar, some noted swelling to facial area near tooth.

## 2017-11-17 NOTE — Discharge Instructions (Signed)
Return to ED for worsening symptoms, trouble breathing or trouble swallowing, trouble opening her mouth, chest pain, trouble moving her neck.

## 2017-11-17 NOTE — ED Provider Notes (Signed)
[facility]-EMERGENCY DEPT Provider Note   CSN: [id number] Arrival date & time: 11/17/17  1005     History   Chief Complaint Chief Complaint  Patient presents with  . Dental Pain    HPI Julie Li is a 52 y.o. female who presents to ED for evaluation of constant, sharp left upper dental pain for the past 3 days. She reports history of similar symptoms in the past relieved with over the counter medications. She has not tried any medications to help with her symptoms. Has not seen a dentist in several years and does not have one that she can follow up with. She denies any trauma to area, bleeding, drainage, fevers, neck pain, trismus, drooling, changes in voice.  HPI  Past Medical History:  Diagnosis Date  . Carpal tunnel syndrome     Patient Active Problem List   Diagnosis Date Noted  . Carpal tunnel syndrome   . 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 UNDERWEIGHT 11/05/2007  . DENTAL CARIES 06/02/2007    History reviewed. No pertinent surgical history.   OB History   None      Home Medications    Prior to Admission medications   Medication Sig Start Date End Date Taking? Authorizing Provider  naproxen sodium (ALEVE) 220 MG tablet Take 220 mg by mouth 2 (two) times daily as needed (pain).   Yes [provider]  meloxicam (MOBIC) 7.5 MG tablet Take 1 tablet (7.5 mg total) by mouth daily. 11/17/17   Rache Klimaszewski, PA-C  penicillin v potassium (VEETID) 500 MG tablet Take 1 tablet (500 mg total) by mouth 4 (four) times daily for 7 days. 11/17/17 11/24/17  Dietrich PatesKhatri, Briggitte Boline, PA-C    Family History Family History  Problem Relation Age of Onset  . Hypertension Mother     Social History Social History   Tobacco Use  . Smoking status: Never Smoker  . Smokeless tobacco: Never Used  Substance Use Topics  . Alcohol use: No  . Drug use: No     Allergies   Sulfonamide derivatives and Tramadol hcl   Review of Systems Review of Systems    Constitutional: Negative for chills and fever.  HENT: Positive for dental problem. Negative for drooling, sinus pressure, sinus pain and sore throat.   Respiratory: Negative for shortness of breath.   Musculoskeletal: Negative for neck pain and neck stiffness.     Physical Exam Updated Vital Signs BP 117/80 (BP Location: Right Arm)   Pulse 99   Temp 97.7 F (36.5 C) (Oral)   Resp 16   Ht 5\' 4"  (1.626 m)   Wt 50.8 kg (112 lb)   LMP 10/16/2013   SpO2 99%   BMI 19.22 kg/m   Physical Exam  Constitutional: She appears well-developed and well-nourished. No distress.  HENT:  Head: Normocephalic and atraumatic.  Mouth/Throat: Uvula is midline, oropharynx is clear and moist and mucous membranes are normal. She does not have dentures. No oral lesions. No trismus in the jaw. Abnormal dentition. Dental caries present. No dental abscesses, uvula swelling or lacerations. Tonsils are 0 on the right. Tonsils are 0 on the left. No tonsillar exudate.    Overall poor dentition with several missing and decaying teeth/caries noted. Tenderness to palpation over the indicated tooth as noted in the image.  No gross dental abscess or site of drainage at this time. No facial, neck or cheek swelling noted. No pooling of secretions or trismus.  Normal voice noted with no difficulty  swallowing or breathing.  No submandibular erythema, edema or crepitus noted.  Eyes: Conjunctivae and EOM are normal. No scleral icterus.  Neck: Normal range of motion.  Pulmonary/Chest: Effort normal. No respiratory distress.  Neurological: She is alert.  Skin: No rash noted. She is not diaphoretic.  Psychiatric: She has a normal mood and affect.  Nursing note and vitals reviewed.    ED Treatments / Results  Labs (all labs ordered are listed, but only abnormal results are displayed) Labs Reviewed - No data to display  EKG None  Radiology No results found.  Procedures Procedures (including critical care  time)  Medications Ordered in ED Medications  oxyCODONE-acetaminophen (PERCOCET/ROXICET) 5-325 MG per tablet 1 tablet (1 tablet Oral Given 11/17/17 1045)  penicillin v potassium (VEETID) tablet 500 mg (500 mg Oral Given 11/17/17 1045)     Initial Impression / Assessment and Plan / ED Course  I have reviewed the triage vital signs and the nursing notes.  Pertinent labs & imaging results that were available during my care of the patient were reviewed by me and considered in my medical decision making (see chart for details).     Patient with dentalgia. On exam, there is no evidence of a drainable abscess. No trismus, glossal elevation, unilateral tonsillar swelling. No evidence of retropharyngeal or peritonsillar abscess or Ludwig angina. Will treat with  pain medication and antibiotics. Pt instructed to follow-up with dentist as soon as possible. Resource guide provided with AVS.   Final Clinical Impressions(s) / ED Diagnoses   Final diagnoses:  Pain, dental    ED Discharge Orders        Ordered    penicillin v potassium (VEETID) 500 MG tablet  4 times daily     11/17/17 1048    meloxicam (MOBIC) 7.5 MG tablet  Daily     11/17/17 1048       Dietrich Pates, PA-C 11/17/17 1048    Margarita Grizzle, MD 11/17/17 1552

## 2017-11-23 ENCOUNTER — Other Ambulatory Visit: Payer: Self-pay

## 2017-11-23 ENCOUNTER — Emergency Department (HOSPITAL_COMMUNITY)
Admission: EM | Admit: 2017-11-23 | Discharge: 2017-11-23 | Disposition: A | Discharge status: Home / Self Care | Payer: Self-pay | Attending: Emergency Medicine | Admitting: Emergency Medicine

## 2017-11-23 ENCOUNTER — Encounter (HOSPITAL_COMMUNITY): Payer: Self-pay

## 2017-11-23 DIAGNOSIS — H0011 Chalazion right upper eyelid: Secondary | ICD-10-CM

## 2017-11-23 MED ORDER — FLUORESCEIN SODIUM 1 MG OP STRP
1.0000 | ORAL_STRIP | Freq: Once | OPHTHALMIC | Status: AC
  Administered 2017-11-23: 1 via OPHTHALMIC
  Filled 2017-11-23: qty 1

## 2017-11-23 MED ORDER — TETRACAINE HCL 0.5 % OP SOLN
2.0000 [drp] | Freq: Once | OPHTHALMIC | Status: AC
  Administered 2017-11-23: 2 [drp] via OPHTHALMIC
  Filled 2017-11-23: qty 4

## 2017-11-23 MED ORDER — OLOPATADINE HCL 0.2 % OP SOLN: 2.0000 [drp] | Freq: Three times a day (TID) | OPHTHALMIC | 0 refills | Status: AC

## 2017-11-23 NOTE — ED Provider Notes (Addendum)
[facility]-EMERGENCY DEPT Provider Note  CSN: [id number] Arrival date & time: 11/23/17  1337  History   Chief Complaint Chief Complaint  Patient presents with  . eye irritation    HPI Julie Li is a 52 y.o. female with no significant medical history who presented to the ED for eye irritation x3 days. She describes itching and swelling of her upper right eyelid. Denies any trauma to the face or eye, recent travel or new allergens. Associated symptoms: occasional tearing. Denies fever, headache, vision changes, eye pain, eye redness or photophobia. Patient has tried OTC saline drops prior to coming to the ED.   Past Medical History:  Diagnosis Date  . Carpal tunnel syndrome     Patient Active Problem List   Diagnosis Date Noted  . Carpal tunnel syndrome   . 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 UNDERWEIGHT 11/05/2007  . DENTAL CARIES 06/02/2007    History reviewed. No pertinent surgical history.   OB History   None      Home Medications    Prior to Admission medications   Medication Sig Start Date End Date Taking? Authorizing Provider  meloxicam (MOBIC) 7.5 MG tablet Take 1 tablet (7.5 mg total) by mouth daily. 11/17/17   Khatri, Hina, PA-C  naproxen sodium (ALEVE) 220 MG tablet Take 220 mg by mouth 2 (two) times daily as needed (pain).    [provider]  Olopatadine HCl 0.2 % SOLN Apply 2 drops to eye 3 (three) times daily for 7 days. 11/23/17 11/30/17  Mortis, Jerrel Ivory I, PA-C  penicillin v potassium (VEETID) 500 MG tablet Take 1 tablet (500 mg total) by mouth 4 (four) times daily for 7 days. 11/17/17 11/24/17  Dietrich Pates, PA-C    Family History Family History  Problem Relation Age of Onset  . Hypertension Mother     Social History Social History   Tobacco Use  . Smoking status: Never Smoker  . Smokeless tobacco: Never Used  Substance Use Topics  . Alcohol use: No  . Drug use: No   Allergies   Sulfonamide derivatives  and Tramadol hcl   Review of Systems Review of Systems  Constitutional: Negative.   HENT: Negative.   Eyes: Positive for itching. Negative for photophobia, pain, discharge, redness and visual disturbance.       Swelling of right upper eyelid  Skin: Negative.   Neurological: Negative.      Physical Exam Updated Vital Signs BP 111/88   Pulse 86   Temp 98.6 F (37 C) (Oral)   Resp 16   Ht 5\' 4"  (1.626 m)   Wt 49.9 kg   LMP 10/16/2013   SpO2 100%   BMI 18.88 kg/m   Physical Exam  Constitutional: Vital signs are normal. She is cooperative.  Sitting comfortably in chair. Appears older than stated age.  Eyes: Pupils are equal, round, and reactive to light. EOM and lids are normal. Lids are everted and swept, no foreign bodies found. Right eye exhibits no discharge. Left eye exhibits no discharge.  Slit lamp exam:      The right eye shows no corneal abrasion, no corneal flare, no corneal ulcer, no foreign body and no hyphema.       The left eye shows no corneal abrasion, no corneal flare, no corneal ulcer, no foreign body and no hyphema.  Visual acuity: both 20/25, R 20/25, L 20/30 Arcus senilis bilaterally. No erythema, injection or discharge from eyes. ~47mm well-defined, nontender, rubbery nodule palpated  in right upper eyelid. No corneal abrasions or acute abnormalities seen with fluorescein exam.  Neurological: She is alert.  Skin: Skin is warm and intact. Capillary refill takes 2 to 3 seconds. No rash noted. No erythema.  Nursing note and vitals reviewed.  ED Treatments / Results  Labs (all labs ordered are listed, but only abnormal results are displayed) Labs Reviewed - No data to display  EKG None  Radiology No results found.  Procedures Procedures (including critical care time)  Medications Ordered in ED Medications  fluorescein ophthalmic strip 1 strip (1 strip Left Eye Given by Other 11/23/17 1403)  tetracaine (PONTOCAINE) 0.5 % ophthalmic solution 2 drop  (2 drops Left Eye Given by Other 11/23/17 1403)   Initial Impression / Assessment and Plan / ED Course  Triage vital signs and the nursing notes have been reviewed.  Pertinent labs & imaging results that were available during care of the patient were reviewed and considered in medical decision making (see chart for details).   Patient presents with 3 day history of eye irritation. It is reassuring that per history patient has not had any vision changes, eye pain or eye redness associated with this which assists in considering ophthalmic emergencies such as retinal detachment or acute glaucoma. On physical exam, a non-tender nodule was palpated in the right upper eyelid that was consistent with chalazion. Area was not fluctuant, warm or painful. No corneal injuries or hyphema seen with fluorescein. No foreign bodies that needed removal   Final Clinical Impressions(s) / ED Diagnoses  1. Chalazion. Pataday drops prescribed for itching relief. Education on applying warm compresses to assist with resolution. Advised to follow-up with eye doctor as scheduled. Education on s/s that would warrant return to PCP/eye doctor vs ED.  Dispo: Home. After thorough clinical evaluation, this patient is determined to be medically stable and can be safely discharged with the previously mentioned treatment and/or outpatient follow-up/referral(s). At this time, there are no other apparent medical conditions that require further screening, evaluation or treatment.  Final diagnoses:  Chalazion of left upper eyelid    ED Discharge Orders         Ordered    Olopatadine HCl 0.2 % SOLN  3 times daily     11/23/17 1422            Mortis, San MiguelGabrielle I, PA-C 11/23/17 1437    Mortis, [place] SpringsGabrielle I, PA-C 11/23/17 1438    Tilden Fossaees, Elizabeth, MD 11/24/17 [phone]

## 2017-11-23 NOTE — Discharge Instructions (Addendum)
The swelling in your eyelid is called a chalazion. Over time, it will resolve on its own. You can use warm compresses for 15 minutes in the area 3-4 times a day to help.  I have prescribed you some eye drops that will help with itching. You may also take OTC allergy medicine as well.  Please follow-up with your eye doctor as scheduled. If you do not have one, I have placed the name of one below.  Follow-up with a medical provider sooner if you experience any of the following: severe eye pain, vision changes (blurry, double, loss of vision) or eye redness.

## 2017-11-23 NOTE — ED Triage Notes (Signed)
Patient c/o eye irritation(itching and burning) and slight swelling of the upper eye lids x 3 days.

## 2017-12-24 ENCOUNTER — Encounter (HOSPITAL_COMMUNITY): Payer: Self-pay | Admitting: Emergency Medicine

## 2017-12-24 ENCOUNTER — Emergency Department (HOSPITAL_COMMUNITY)
Admission: EM | Admit: 2017-12-24 | Discharge: 2017-12-24 | Disposition: A | Discharge status: Home / Self Care | Payer: Self-pay | Attending: Emergency Medicine | Admitting: Emergency Medicine

## 2017-12-24 DIAGNOSIS — X509XXA Other and unspecified overexertion or strenuous movements or postures, initial encounter: Secondary | ICD-10-CM | POA: Insufficient documentation

## 2017-12-24 DIAGNOSIS — Y990 Civilian activity done for income or pay: Secondary | ICD-10-CM | POA: Insufficient documentation

## 2017-12-24 DIAGNOSIS — S39012A Strain of muscle, fascia and tendon of lower back, initial encounter: Secondary | ICD-10-CM | POA: Insufficient documentation

## 2017-12-24 DIAGNOSIS — Z79899 Other long term (current) drug therapy: Secondary | ICD-10-CM | POA: Insufficient documentation

## 2017-12-24 DIAGNOSIS — Y9289 Other specified places as the place of occurrence of the external cause: Secondary | ICD-10-CM | POA: Insufficient documentation

## 2017-12-24 DIAGNOSIS — Y9389 Activity, other specified: Secondary | ICD-10-CM | POA: Insufficient documentation

## 2017-12-24 MED ORDER — CYCLOBENZAPRINE HCL 5 MG PO TABS: 10.0000 mg | ORAL_TABLET | Freq: Two times a day (BID) | ORAL | 0 refills | Status: DC | PRN

## 2017-12-24 MED ORDER — PREDNISONE 10 MG PO TABS: ORAL_TABLET | ORAL | 0 refills | Status: DC

## 2017-12-24 NOTE — ED Provider Notes (Signed)
[facility]-EMERGENCY DEPT Provider Note   CSN: [id number] Arrival date & time: 12/24/17  1833     History   Chief Complaint Chief Complaint  Patient presents with  . Back Pain    HPI Julie Li is a 52 y.o. female.  Pt comes in with c/o lower back pain times a couple of days. No known injury although she states that she does sometimes do heavy lifting at [employer] where she works. She has taken ibuprofen. Denies numbness, weakness or incontinence. No fever or dysuria. Denies chronic history of pain     Past Medical History:  Diagnosis Date  . Carpal tunnel syndrome     Patient Active Problem List   Diagnosis Date Noted  . Carpal tunnel syndrome   . 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 UNDERWEIGHT 11/05/2007  . DENTAL CARIES 06/02/2007    History reviewed. No pertinent surgical history.   OB History   None      Home Medications    Prior to Admission medications   Medication Sig Start Date End Date Taking? Authorizing Provider  meloxicam (MOBIC) 7.5 MG tablet Take 1 tablet (7.5 mg total) by mouth daily. 11/17/17   Khatri, Hina, PA-C  naproxen sodium (ALEVE) 220 MG tablet Take 220 mg by mouth 2 (two) times daily as needed (pain).    [provider]    Family History Family History  Problem Relation Age of Onset  . Hypertension Mother     Social History Social History   Tobacco Use  . Smoking status: Never Smoker  . Smokeless tobacco: Never Used  Substance Use Topics  . Alcohol use: No  . Drug use: No     Allergies   Sulfonamide derivatives and Tramadol hcl   Review of Systems Review of Systems  All other systems reviewed and are negative.    Physical Exam Updated Vital Signs BP 112/84 (BP Location: Right Arm)   Pulse 90   Temp 98.2 F (36.8 C) (Oral)   Resp 18   LMP 10/16/2013   SpO2 100%   Physical Exam  Constitutional: She appears well-developed and well-nourished.  HENT:  Head:  Normocephalic.  Cardiovascular: Normal rate.  Pulmonary/Chest: Effort normal.  Abdominal: Soft.  Musculoskeletal: Normal range of motion.  Able to do bilateral straight leg raises. Mild lumbar paraspinal tenderness  Neurological: She is alert. Coordination normal.  Skin: Skin is warm and dry.  Psychiatric: She has a normal mood and affect.  Nursing note and vitals reviewed.    ED Treatments / Results  Labs (all labs ordered are listed, but only abnormal results are displayed) Labs Reviewed - No data to display  EKG None  Radiology No results found.  Procedures Procedures (including critical care time)  Medications Ordered in ED Medications - No data to display   Initial Impression / Assessment and Plan / ED Course  I have reviewed the triage vital signs and the nursing notes.  Pertinent labs & imaging results that were available during my care of the patient were reviewed by me and considered in my medical decision making (see chart for details).     Pt is neurologically intact. Don't think need for x-ray at this time. Will use steroids and muscle relaxer  Final Clinical Impressions(s) / ED Diagnoses   Final diagnoses:  None    ED Discharge Orders    None       Teressa Lower, NP 12/24/17 1923    Melene Plan,  DO 12/24/17 2346

## 2017-12-24 NOTE — ED Triage Notes (Signed)
Pt reports intermittent lower back pain for couple days. Reports that she lifts boxes at work. Denies falls or injuries

## 2018-02-04 ENCOUNTER — Other Ambulatory Visit: Payer: Self-pay

## 2018-02-04 ENCOUNTER — Emergency Department (HOSPITAL_COMMUNITY)
Admission: EM | Admit: 2018-02-04 | Discharge: 2018-02-04 | Disposition: A | Discharge status: Home / Self Care | Payer: Self-pay | Attending: Emergency Medicine | Admitting: Emergency Medicine

## 2018-02-04 ENCOUNTER — Encounter (HOSPITAL_COMMUNITY): Payer: Self-pay | Admitting: Emergency Medicine

## 2018-02-04 DIAGNOSIS — X58XXXA Exposure to other specified factors, initial encounter: Secondary | ICD-10-CM | POA: Insufficient documentation

## 2018-02-04 DIAGNOSIS — Y998 Other external cause status: Secondary | ICD-10-CM | POA: Insufficient documentation

## 2018-02-04 DIAGNOSIS — Y9389 Activity, other specified: Secondary | ICD-10-CM | POA: Insufficient documentation

## 2018-02-04 DIAGNOSIS — S29012A Strain of muscle and tendon of back wall of thorax, initial encounter: Secondary | ICD-10-CM | POA: Insufficient documentation

## 2018-02-04 DIAGNOSIS — T148XXA Other injury of unspecified body region, initial encounter: Secondary | ICD-10-CM

## 2018-02-04 DIAGNOSIS — Y9289 Other specified places as the place of occurrence of the external cause: Secondary | ICD-10-CM | POA: Insufficient documentation

## 2018-02-04 DIAGNOSIS — M62830 Muscle spasm of back: Secondary | ICD-10-CM | POA: Insufficient documentation

## 2018-02-04 MED ORDER — CYCLOBENZAPRINE HCL 5 MG PO TABS: 10.0000 mg | ORAL_TABLET | Freq: Two times a day (BID) | ORAL | 0 refills | Status: DC | PRN

## 2018-02-04 NOTE — ED Provider Notes (Signed)
[facility]-EMERGENCY DEPT Provider Note   CSN: [id number] Arrival date & time: 02/04/18  7829     History   Chief Complaint Chief Complaint  Patient presents with  . Back Pain    HPI Julie Li is a 52 y.o. female.  HPI  52 year old female with history of carpal tunnel syndrome comes in with chief complaint of back pain.  Patient states that her back pain started last month.  Since then she has been having intermittent pain over the right lower back, just above the pelvic area.  Patient denies any specific evoking factor to this pain.  Her pain started getting intense overnight and she was unable to sleep therefore she came to the ER.  Pain is worse with movement and when she stands up or tries to sit up. Her job involves bending and lifting small weights.   Patient has no pain with urination, blood in the urine, or frequent urination.  There is no history of kidney stones or infection.   Past Medical History:  Diagnosis Date  . Carpal tunnel syndrome     Patient Active Problem List   Diagnosis Date Noted  . Carpal tunnel syndrome   . 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 UNDERWEIGHT 11/05/2007  . DENTAL CARIES 06/02/2007    History reviewed. No pertinent surgical history.   OB History   None      Home Medications    Prior to Admission medications   Medication Sig Start Date End Date Taking? Authorizing Provider  cyclobenzaprine (FLEXERIL) 5 MG tablet Take 2 tablets (10 mg total) by mouth 2 (two) times daily as needed for muscle spasms. 02/04/18   Derwood Kaplan, MD  meloxicam (MOBIC) 7.5 MG tablet Take 1 tablet (7.5 mg total) by mouth daily. 11/17/17   Khatri, Hina, PA-C  naproxen sodium (ALEVE) 220 MG tablet Take 220 mg by mouth 2 (two) times daily as needed (pain).    [provider]  predniSONE (DELTASONE) 10 MG tablet 6 day taper 12/24/17   Teressa Lower, NP    Family History Family History  Problem Relation Age  of Onset  . Hypertension Mother     Social History Social History   Tobacco Use  . Smoking status: Never Smoker  . Smokeless tobacco: Never Used  Substance Use Topics  . Alcohol use: No  . Drug use: No     Allergies   Sulfonamide derivatives and Tramadol hcl   Review of Systems Review of Systems  Constitutional: Positive for activity change.  Gastrointestinal: Negative for abdominal pain.  Genitourinary: Negative for dysuria.  Musculoskeletal: Positive for back pain.     Physical Exam Updated Vital Signs BP 113/81 (BP Location: Left Arm)   Pulse 83   Temp 98.2 F (36.8 C) (Oral)   Resp 16   Ht 5\' 2"  (1.575 m)   Wt 49.9 kg   LMP 10/16/2013   SpO2 100%   BMI 20.12 kg/m   Physical Exam  Constitutional: She is oriented to person, place, and time. She appears well-developed.  HENT:  Head: Normocephalic and atraumatic.  Eyes: EOM are normal.  Neck: Normal range of motion. Neck supple.  Cardiovascular: Normal rate.  Pulmonary/Chest: Effort normal.  Abdominal: Bowel sounds are normal. There is no tenderness.  Musculoskeletal:  On exam patient has clear evidence of spasms right at the area of her tenderness on the right lower back laterally above the pelvis. Tenderness is worse with manipulation of the hip.  Her  tenderness is also worse when she attempts active leg raise.  Neurological: She is alert and oriented to person, place, and time.  Skin: Skin is warm and dry.  Nursing note and vitals reviewed.    ED Treatments / Results  Labs (all labs ordered are listed, but only abnormal results are displayed) Labs Reviewed - No data to display  EKG None  Radiology No results found.  Procedures Procedures (including critical care time)  Medications Ordered in ED Medications - No data to display   Initial Impression / Assessment and Plan / ED Course  I have reviewed the triage vital signs and the nursing notes.  Pertinent labs & imaging results that  were available during my care of the patient were reviewed by me and considered in my medical decision making (see chart for details).     52 year old female comes in with chief complaint of back pain.  Patient was seen in the ER last month with similar complaints and discharged with prednisone and muscle relaxants.  On my exam she has a focal tenderness over the right lower back above the pelvis.  She has a notable spasm and the tenderness is worse with palpation of that region.  We have advised conservative management with deep tissue massage and warm compresses.  Patient will take muscle relaxant only if needed.  Rest of the history was not suggestive of any abdominal or GU etiology for her symptoms.  Final Clinical Impressions(s) / ED Diagnoses   Final diagnoses:  Muscle spasm of back  Muscle strain    ED Discharge Orders         Ordered    cyclobenzaprine (FLEXERIL) 5 MG tablet  2 times daily PRN     02/04/18 0539           Derwood Kaplan, MD 02/04/18 [phone]

## 2018-02-04 NOTE — ED Triage Notes (Signed)
Pt reports continuing back pain for the last month without any relief.

## 2018-02-04 NOTE — Discharge Instructions (Addendum)
We saw you in the ER for back pain.  It appears that based on exam you have a muscle spasm right above the pelvis. As discussed, it would be worthwhile to break the spasms with deep/pressure massage at the site.  Heat packs might also help. Muscle relaxants can be taken as needed.

## 2018-05-24 ENCOUNTER — Encounter (HOSPITAL_COMMUNITY): Payer: Self-pay | Admitting: Emergency Medicine

## 2018-05-24 ENCOUNTER — Emergency Department (HOSPITAL_COMMUNITY)
Admission: EM | Admit: 2018-05-24 | Discharge: 2018-05-24 | Disposition: A | Discharge status: Home / Self Care | Payer: BLUE CROSS/BLUE SHIELD | Attending: Emergency Medicine | Admitting: Emergency Medicine

## 2018-05-24 ENCOUNTER — Emergency Department (HOSPITAL_COMMUNITY): Payer: BLUE CROSS/BLUE SHIELD

## 2018-05-24 DIAGNOSIS — R69 Illness, unspecified: Secondary | ICD-10-CM

## 2018-05-24 DIAGNOSIS — R05 Cough: Secondary | ICD-10-CM | POA: Diagnosis present

## 2018-05-24 DIAGNOSIS — J111 Influenza due to unidentified influenza virus with other respiratory manifestations: Secondary | ICD-10-CM

## 2018-05-24 DIAGNOSIS — Z79899 Other long term (current) drug therapy: Secondary | ICD-10-CM | POA: Diagnosis not present

## 2018-05-24 MED ORDER — HYDROCODONE-HOMATROPINE 5-1.5 MG/5ML PO SYRP: ORAL_SOLUTION | ORAL | 0 refills | Status: DC

## 2018-05-24 MED ORDER — ACETAMINOPHEN 500 MG PO TABS
1000.0000 mg | ORAL_TABLET | Freq: Once | ORAL | Status: AC
  Administered 2018-05-24: 1000 mg via ORAL
  Filled 2018-05-24: qty 2

## 2018-05-24 MED ORDER — OSELTAMIVIR PHOSPHATE 75 MG PO CAPS: 75.0000 mg | ORAL_CAPSULE | Freq: Two times a day (BID) | ORAL | 0 refills | Status: DC

## 2018-05-24 MED ORDER — HYDROCODONE-HOMATROPINE 5-1.5 MG/5ML PO SYRP
5.0000 mL | ORAL_SOLUTION | Freq: Once | ORAL | Status: AC
  Administered 2018-05-24: 5 mL via ORAL
  Filled 2018-05-24: qty 5

## 2018-05-24 MED ORDER — ACETAMINOPHEN 500 MG PO TABS: 1000.0000 mg | ORAL_TABLET | Freq: Four times a day (QID) | ORAL | 0 refills | Status: DC | PRN

## 2018-05-24 NOTE — ED Provider Notes (Signed)
[facility]   CSN: [id number] Arrival date & time: 05/24/18  0701     History   Chief Complaint Chief Complaint  Patient presents with  . Cough    HPI Julie Li is a 53 y.o. female.  HPI Symptoms onset 2 days ago.  For symptom coughing, generalized body ache, mild headache.  Patient reports that she does not have sore throat.  Mild nausea with no vomiting and no abdominal pain.  No diarrhea.  Patient reports he does have some chest pain with coughing.  She denies any shortness of breath.  She reports cough is mostly dry.  At baseline, she reports she is healthy. Past Medical History:  Diagnosis Date  . Carpal tunnel syndrome     Patient Active Problem List   Diagnosis Date Noted  . Carpal tunnel syndrome   . 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 UNDERWEIGHT 11/05/2007  . DENTAL CARIES 06/02/2007    History reviewed. No pertinent surgical history.   OB History   No obstetric history on file.      Home Medications    Prior to Admission medications   Medication Sig Start Date End Date Taking? Authorizing Provider  cyclobenzaprine (FLEXERIL) 5 MG tablet Take 2 tablets (10 mg total) by mouth 2 (two) times daily as needed for muscle spasms. 02/04/18   Derwood Kaplan, MD  meloxicam (MOBIC) 7.5 MG tablet Take 1 tablet (7.5 mg total) by mouth daily. 11/17/17   Khatri, Hina, PA-C  naproxen sodium (ALEVE) 220 MG tablet Take 220 mg by mouth 2 (two) times daily as needed (pain).    [provider]  predniSONE (DELTASONE) 10 MG tablet 6 day taper 12/24/17   Teressa Lower, NP    Family History Family History  Problem Relation Age of Onset  . Hypertension Mother     Social History Social History   Tobacco Use  . Smoking status: Never Smoker  . Smokeless tobacco: Never Used  Substance Use Topics  . Alcohol use: No  . Drug use: No     Allergies   Sulfonamide derivatives and Tramadol  hcl   Review of Systems Review of Systems 10 Systems reviewed and are negative for acute change except as noted in the HPI.   Physical Exam Updated Vital Signs BP 93/79 (BP Location: Right Arm)   Pulse 89   Temp 99.5 F (37.5 C) (Oral)   Resp 15   LMP 10/16/2013   SpO2 99%   Physical Exam Constitutional:      Comments: Alert nontoxic.  No respiratory distress.  HENT:     Head: Normocephalic and atraumatic.     Mouth/Throat:     Comments: Poor dentition.  Mucous membranes pink and moist.  Posterior oropharynx widely patent without erythema or exudate. Eyes:     Extraocular Movements: Extraocular movements intact.  Neck:     Musculoskeletal: Neck supple.  Cardiovascular:     Rate and Rhythm: Normal rate and regular rhythm.  Pulmonary:     Effort: Pulmonary effort is normal.     Breath sounds: Normal breath sounds.     Comments: Intermittent harsh paroxysmal cough dry with deep inspiration. Abdominal:     General: There is no distension.     Palpations: Abdomen is soft.     Tenderness: There is no abdominal tenderness. There is no guarding.  Musculoskeletal: Normal range of motion.        General: No swelling or tenderness.  Right lower leg: No edema.     Left lower leg: No edema.  Skin:    General: Skin is warm and dry.     Findings: No rash.  Neurological:     General: No focal deficit present.     Mental Status: She is oriented to person, place, and time.     Coordination: Coordination normal.  Psychiatric:        Mood and Affect: Mood normal.      ED Treatments / Results  Labs (all labs ordered are listed, but only abnormal results are displayed) Labs Reviewed - No data to display  EKG None  Radiology No results found.  Procedures Procedures (including critical care time)  Medications Ordered in ED Medications - No data to display   Initial Impression / Assessment and Plan / ED Course  I have reviewed the triage vital signs and the  nursing notes.  Pertinent labs & imaging results that were available during my care of the patient were reviewed by me and considered in my medical decision making (see chart for details).    Patient present with symptoms of like illness.  She is alert and appropriate.  No respiratory distress.  Patient is stable for outpatient management.  Return precautions are reviewed.  Patient is also counseled on necessity to start routine primary care and health maintenance.  Instructions for follow-up and return precautions provided.  Final Clinical Impressions(s) / ED Diagnoses   Final diagnoses:  Influenza-like illness    ED Discharge Orders    None       Arby Barrette, MD 05/30/18 1441

## 2018-05-24 NOTE — Discharge Instructions (Signed)
1.  Your symptoms are very suggestive of influenza.  You have been given a prescription for Tamiflu.  You must start this medication today for it to be effective.  Go directly to the pharmacy to fill the prescription. 2.  Take acetaminophen every 6 hours as needed for body aches or fever.  You may use hydrocodone syrup every 6 hours for harsh coughing.  Try to rest and stay hydrated. 3.  It is very important that you start care with a family physician.  You are now due for routine screening for medical conditions and should always have a follow-up examination from the emergency department.  Use the referral number in your discharge instructions to help find a physician.  Try to establish care and get a recheck in the next 5 to 7 days. 4.  Return to the emergency department if your symptoms are worsening or other concerns develop.

## 2018-05-24 NOTE — ED Triage Notes (Signed)
Pt c/o dry cough since Thursday.

## 2018-06-26 ENCOUNTER — Other Ambulatory Visit: Payer: Self-pay

## 2018-06-26 ENCOUNTER — Emergency Department (HOSPITAL_COMMUNITY)
Admission: EM | Admit: 2018-06-26 | Discharge: 2018-06-26 | Disposition: A | Discharge status: Home / Self Care | Payer: BLUE CROSS/BLUE SHIELD | Attending: Emergency Medicine | Admitting: Emergency Medicine

## 2018-06-26 DIAGNOSIS — K0889 Other specified disorders of teeth and supporting structures: Secondary | ICD-10-CM | POA: Insufficient documentation

## 2018-06-26 MED ORDER — LIDOCAINE VISCOUS HCL 2 % MT SOLN: 15.0000 mL | OROMUCOSAL | 2 refills | Status: DC | PRN

## 2018-06-26 MED ORDER — AMOXICILLIN 500 MG PO CAPS: 500.0000 mg | ORAL_CAPSULE | Freq: Three times a day (TID) | ORAL | 0 refills | Status: DC

## 2018-06-26 NOTE — ED Provider Notes (Signed)
[facility] Provider Note   CSN: [id number] Arrival date & time: 06/26/18  0745    History   Chief Complaint No chief complaint on file.   HPI Julie Li is a 52 y.o. female.     HPI   Patient arrived and was seen during Epic downtime.  Julie Li is a 53 y.o. female, presenting to the ED with dental pain for the past 2 to 3 days.  Accompanied by some mild upper lip and facial swelling.  Pain is aching and throbbing, severe, nonradiating from the immediate area. She has not tried any therapies for her complaint. Denies fever/chills, nausea/vomiting, difficulty breathing or swallowing, drainage, drooling, or any other complaints.   Past Medical History:  Diagnosis Date  . Carpal tunnel syndrome     Patient Active Problem List   Diagnosis Date Noted  . Carpal tunnel syndrome   . 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 UNDERWEIGHT 11/05/2007  . DENTAL CARIES 06/02/2007    No past surgical history on file.   OB History   No obstetric history on file.      Home Medications    Prior to Admission medications   Medication Sig Start Date End Date Taking? Authorizing Provider  acetaminophen (TYLENOL) 500 MG tablet Take 2 tablets (1,000 mg total) by mouth every 6 (six) hours as needed. 05/24/18  Yes Arby Barrette, MD  amoxicillin (AMOXIL) 500 MG capsule Take 1 capsule (500 mg total) by mouth 3 (three) times daily. 06/26/18   Glenora Morocho C, PA-C  cyclobenzaprine (FLEXERIL) 5 MG tablet Take 2 tablets (10 mg total) by mouth 2 (two) times daily as needed for muscle spasms. Patient not taking: Reported on 05/24/2018 02/04/18   Derwood Kaplan, MD  HYDROcodone-homatropine [facility]) 5-1.5 MG/5ML syrup 5 to 10 mL every 6 hours as needed for harsh cough. Patient not taking: Reported on 06/26/2018 05/24/18   Arby Barrette, MD  lidocaine (XYLOCAINE) 2 % solution Use as directed 15 mLs in the mouth or throat as needed for mouth pain.  06/26/18   Tandy Lewin C, PA-C  meloxicam (MOBIC) 7.5 MG tablet Take 1 tablet (7.5 mg total) by mouth daily. Patient not taking: Reported on 05/24/2018 11/17/17   Dietrich Pates, PA-C  oseltamivir (TAMIFLU) 75 MG capsule Take 1 capsule (75 mg total) by mouth every 12 (twelve) hours. Must start this prescription on 05/24/2018 Patient not taking: Reported on 06/26/2018 05/24/18   Arby Barrette, MD  predniSONE (DELTASONE) 10 MG tablet 6 day taper Patient not taking: Reported on 05/24/2018 12/24/17   Teressa Lower, NP    Family History Family History  Problem Relation Age of Onset  . Hypertension Mother     Social History Social History   Tobacco Use  . Smoking status: Never Smoker  . Smokeless tobacco: Never Used  Substance Use Topics  . Alcohol use: No  . Drug use: No     Allergies   Sulfonamide derivatives and Tramadol hcl   Review of Systems Review of Systems  Constitutional: Negative for chills and fever.  HENT: Positive for dental problem and facial swelling. Negative for drooling, sore throat, trouble swallowing and voice change.   Respiratory: Negative for choking and shortness of breath.   Gastrointestinal: Negative for nausea and vomiting.     Physical Exam Updated Vital Signs BP 132/78   Pulse 94   Temp 98.4 F (36.9 C) (Oral)   Resp 17   LMP 10/16/2013   SpO2 99%  Physical Exam Vitals signs and nursing note reviewed.  Constitutional:      General: She is not in acute distress.    Appearance: She is well-developed. She is not diaphoretic.  HENT:     Head: Normocephalic and atraumatic.     Nose: Nose normal.     Mouth/Throat:     Mouth: Mucous membranes are moist.     Dentition: Dental tenderness and dental caries present.      Comments: Tenderness to the gingiva around the left maxillary incisors.  Extensive erosion and dental caries to many of the patient's teeth, though patient notes she has not had any recent dental fractures or trauma.  Some mild  localized facial swelling.  No noted area of swelling or fluctuance.  No trismus.  Mouth opening to at least 3 finger widths.  Handles oral secretions without difficulty.  No sublingual swelling.  No swelling or tenderness to the submental or submandibular regions.  No swelling or tenderness into the soft tissues of the neck. Eyes:     Conjunctiva/sclera: Conjunctivae normal.  Neck:     Musculoskeletal: Normal range of motion and neck supple.  Cardiovascular:     Rate and Rhythm: Normal rate and regular rhythm.  Pulmonary:     Effort: Pulmonary effort is normal.  Lymphadenopathy:     Cervical: No cervical adenopathy.  Skin:    General: Skin is warm and dry.     Coloration: Skin is not pale.  Neurological:     Mental Status: She is alert.  Psychiatric:        Behavior: Behavior normal.      ED Treatments / Results  Labs (all labs ordered are listed, but only abnormal results are displayed) Labs Reviewed - No data to display  EKG None  Radiology No results found.  Procedures Procedures (including critical care time)  Medications Ordered in ED Medications - No data to display   Initial Impression / Assessment and Plan / ED Course  I have reviewed the triage vital signs and the nursing notes.  Pertinent labs & imaging results that were available during my care of the patient were reviewed by me and considered in my medical decision making (see chart for details).        Patient presents with dental pain.  Low suspicion for sepsis or other more serious issue.  Patient was offered a dental block with explanation, but declined.  Dentist follow-up recommended.  Resources given.  The patient was given instructions for home care as well as return precautions. Patient voices understanding of these instructions, accepts the plan, and is comfortable with discharge.  Final Clinical Impressions(s) / ED Diagnoses   Final diagnoses:  Pain, dental    ED Discharge Orders          Ordered    lidocaine (XYLOCAINE) 2 % solution  As needed     06/26/18 0913    amoxicillin (AMOXIL) 500 MG capsule  3 times daily     06/26/18 0913           Anselm Pancoast, PA-C 06/28/18 0951    Tilden Fossa, MD 07/02/18 1147

## 2018-06-26 NOTE — ED Triage Notes (Signed)
Pt from home c/o upper front dental pain x 2 days.  Pt denies any new dental injuries, states that upper front teeth have "been missing for a while." Pt reports increased redness and swelling.

## 2018-06-26 NOTE — Discharge Instructions (Signed)
°  Dental Pain °You have been seen today for dental pain. You should follow up with a dentist as soon as possible. This problem will not resolve on its own without the care of a dentist. Use ibuprofen or naproxen for pain. Use the viscous lidocaine for mouth pain. Swish with the lidocaine and spit it out. Do not swallow it. °You should also swish with a homemade salt water solution, twice a day.  Make this solution by mixing 8 ounces of warm water with about half a teaspoon of salt. °Antiinflammatory medications: Take 600 mg of ibuprofen every 6 hours or 440 mg (over the counter dose) to 500 mg (prescription dose) of naproxen every 12 hours for the next 3 days. After this time, these medications may be used as needed for pain. Take these medications with food to avoid upset stomach. Choose only one of these medications, do not take them together. °Acetaminophen (generic for Tylenol): Should you continue to have additional pain while taking the ibuprofen or naproxen, you may add in acetaminophen as needed. Your daily total maximum amount of acetaminophen from all sources should be limited to 4000mg/day for persons without liver problems, or 2000mg/day for those with liver problems. ° °Please take all of your antibiotics until finished!   You may develop abdominal discomfort or diarrhea from the antibiotic.  You may help offset this with probiotics which you can buy or get in yogurt. Do not eat or take the probiotics until 2 hours after your antibiotic.  ° °For prescription assistance, may try using prescription discount sites or apps, such as goodrx.com ° °Dental Resource Guide ° °[facility] °[address], [address] °[place], [place] [address] °[phone] ° °[facility] °[address] °[place], [place] [address] °[phone] ° °[facility] °[address] °[place], [place] [address] °[phone]. [phone] ° °[facility] °[address] °[place], [place] [address] °[phone] ° °[facility] °[address] °[place], [place] [address] °[phone] ° °[employer] °Www.denistry.[employer].edu/patientcare/studentclinics/becomepatient ° °[employer] [facility] [employer] [facility] °[address] °[place], [place] [address] °[phone] ° °Website for free, low-income, or sliding scale dental services in [place]: °www.freedentalcare.us ° °To find a dentist in [place] and surrounding areas: °www.ncdental.org/for-the-public/find-a-dentist ° °[facility] °http://www.ncdental.org/meetings-events/[place] ° ° Medicaid Dentist °https://dma.ncdhhs.gov/find-a-doctor/medicaid-dental-providers ° °

## 2019-02-09 ENCOUNTER — Ambulatory Visit (HOSPITAL_COMMUNITY)
Admission: EM | Admit: 2019-02-09 | Discharge: 2019-02-09 | Disposition: A | Discharge status: Home / Self Care | Payer: BC Managed Care – PPO | Attending: Family Medicine | Admitting: Family Medicine

## 2019-02-09 ENCOUNTER — Encounter (HOSPITAL_COMMUNITY): Payer: Self-pay | Admitting: Family Medicine

## 2019-02-09 ENCOUNTER — Other Ambulatory Visit: Payer: Self-pay

## 2019-02-09 DIAGNOSIS — K047 Periapical abscess without sinus: Secondary | ICD-10-CM | POA: Diagnosis not present

## 2019-02-09 MED ORDER — AMOXICILLIN 875 MG PO TABS: 875.0000 mg | ORAL_TABLET | Freq: Two times a day (BID) | ORAL | 0 refills | Status: DC

## 2019-02-09 NOTE — ED Provider Notes (Signed)
[place]    CSN: [id number] Arrival date & time: 02/09/19  9379      History   Chief Complaint No chief complaint on file.   HPI Julie Li is a 53 y.o. female.   This is essentially a new visit for this 53 year old woman complaining about dental pain.  She had similar problem in March.  This episode began 2 days ago.  She works for [employer] on [address]   She was seen for dental abscess 7 months ago and treated with Xylocaine mouthwash and amoxicillin.  Past Medical History:  Diagnosis Date  . Carpal tunnel syndrome     Patient Active Problem List   Diagnosis Date Noted  . Carpal tunnel syndrome   . 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 UNDERWEIGHT 11/05/2007  . DENTAL CARIES 06/02/2007    History reviewed. No pertinent surgical history.  OB History   No obstetric history on file.      Home Medications    Prior to Admission medications   Medication Sig Start Date End Date Taking? Authorizing Provider  acetaminophen (TYLENOL) 500 MG tablet Take 2 tablets (1,000 mg total) by mouth every 6 (six) hours as needed. 05/24/18   Charlesetta Shanks, MD  amoxicillin (AMOXIL) 875 MG tablet Take 1 tablet (875 mg total) by mouth 2 (two) times daily. 02/09/19   Robyn Haber, MD    Family History Family History  Problem Relation Age of Onset  . Hypertension Mother     Social History Social History   Tobacco Use  . Smoking status: Never Smoker  . Smokeless tobacco: Never Used  Substance Use Topics  . Alcohol use: No  . Drug use: No     Allergies   Sulfonamide derivatives and Tramadol hcl   Review of Systems Review of Systems  Constitutional: Negative.   HENT: Positive for dental problem.   All other systems reviewed and are negative.    Physical Exam Triage Vital Signs ED Triage Vitals  Enc Vitals Group     BP      Pulse      Resp      Temp      Temp src      SpO2      Weight      Height      Head Circumference       Peak Flow      Pain Score      Pain Loc      Pain Edu?      Excl. in [place]?    No data found.  Updated Vital Signs LMP 10/16/2013   Physical Exam Vitals signs and nursing note reviewed.  Constitutional:      Appearance: Normal appearance. She is normal weight.  HENT:     Mouth/Throat:     Comments: Swollen gingiva teeth number 8 and 9 with only roots showing. Eyes:     Conjunctiva/sclera: Conjunctivae normal.  Cardiovascular:     Rate and Rhythm: Normal rate.  Pulmonary:     Effort: Pulmonary effort is normal.  Musculoskeletal: Normal range of motion.  Skin:    General: Skin is warm.  Neurological:     General: No focal deficit present.     Mental Status: She is alert.  Psychiatric:        Mood and Affect: Mood normal.      UC Treatments / Results  Labs (all labs ordered are listed, but only abnormal results are displayed) Labs  Reviewed - No data to display  EKG   Radiology No results found.  Procedures Procedures (including critical care time)  Medications Ordered in UC Medications - No data to display  Initial Impression / Assessment and Plan / UC Course  I have reviewed the triage vital signs and the nursing notes.  Pertinent labs & imaging results that were available during my care of the patient were reviewed by me and considered in my medical decision making (see chart for details).    Final Clinical Impressions(s) / UC Diagnoses   Final diagnoses:  Dental abscess   Discharge Instructions   None    ED Prescriptions    Medication Sig Dispense Auth. Provider   amoxicillin (AMOXIL) 875 MG tablet Take 1 tablet (875 mg total) by mouth 2 (two) times daily. 20 tablet Elvina Sidle, MD     I have reviewed the PDMP during this encounter.   Elvina Sidle, MD 02/09/19 1018

## 2019-02-09 NOTE — ED Triage Notes (Signed)
Pt presents with dental abscess since yesterday.

## 2019-06-25 ENCOUNTER — Other Ambulatory Visit: Payer: Self-pay

## 2019-06-25 ENCOUNTER — Emergency Department (HOSPITAL_COMMUNITY)
Admission: EM | Admit: 2019-06-25 | Discharge: 2019-06-25 | Disposition: A | Discharge status: Home / Self Care | Payer: 59 | Attending: Emergency Medicine | Admitting: Emergency Medicine

## 2019-06-25 ENCOUNTER — Encounter (HOSPITAL_COMMUNITY): Payer: Self-pay | Admitting: Emergency Medicine

## 2019-06-25 DIAGNOSIS — E860 Dehydration: Secondary | ICD-10-CM | POA: Insufficient documentation

## 2019-06-25 DIAGNOSIS — R42 Dizziness and giddiness: Secondary | ICD-10-CM | POA: Diagnosis present

## 2019-06-25 LAB — CBC WITH DIFFERENTIAL/PLATELET
Abs Immature Granulocytes: 0.01 10*3/uL (ref 0.00–0.07)
Basophils Absolute: 0 10*3/uL (ref 0.0–0.1)
Basophils Relative: 1 %
Eosinophils Absolute: 0.1 10*3/uL (ref 0.0–0.5)
Eosinophils Relative: 2 %
HCT: 38.1 % (ref 36.0–46.0)
Hemoglobin: 12.5 g/dL (ref 12.0–15.0)
Immature Granulocytes: 0 %
Lymphocytes Relative: 48 %
Lymphs Abs: 2.1 10*3/uL (ref 0.7–4.0)
MCH: 31.2 pg (ref 26.0–34.0)
MCHC: 32.8 g/dL (ref 30.0–36.0)
MCV: 95 fL (ref 80.0–100.0)
Monocytes Absolute: 0.3 10*3/uL (ref 0.1–1.0)
Monocytes Relative: 7 %
Neutro Abs: 1.8 10*3/uL (ref 1.7–7.7)
Neutrophils Relative %: 42 %
Platelets: 340 10*3/uL (ref 150–400)
RBC: 4.01 MIL/uL (ref 3.87–5.11)
RDW: 13.3 % (ref 11.5–15.5)
WBC: 4.3 10*3/uL (ref 4.0–10.5)
nRBC: 0 % (ref 0.0–0.2)

## 2019-06-25 LAB — COMPREHENSIVE METABOLIC PANEL
ALT: 10 U/L (ref 0–44)
AST: 12 U/L — ABNORMAL LOW (ref 15–41)
Albumin: 3.7 g/dL (ref 3.5–5.0)
Alkaline Phosphatase: 101 U/L (ref 38–126)
Anion gap: 9 (ref 5–15)
BUN: 16 mg/dL (ref 6–20)
CO2: 25 mmol/L (ref 22–32)
Calcium: 9 mg/dL (ref 8.9–10.3)
Chloride: 104 mmol/L (ref 98–111)
Creatinine, Ser: 0.84 mg/dL (ref 0.44–1.00)
GFR calc Af Amer: >60 mL/min (ref >60)
GFR calc non Af Amer: >60 mL/min (ref >60)
Glucose, Bld: 92 mg/dL (ref 70–99)
Potassium: 4.1 mmol/L (ref 3.5–5.1)
Sodium: 138 mmol/L (ref 135–145)
Total Bilirubin: 1.1 mg/dL (ref 0.3–1.2)
Total Protein: 7.2 g/dL (ref 6.5–8.1)

## 2019-06-25 LAB — URINALYSIS, ROUTINE W REFLEX MICROSCOPIC
Bilirubin Urine: NEGATIVE
Glucose, UA: NEGATIVE mg/dL
Hgb urine dipstick: NEGATIVE
Ketones, ur: 5 mg/dL — AB
Nitrite: NEGATIVE
Protein, ur: NEGATIVE mg/dL
Specific Gravity, Urine: 1.017 (ref 1.005–1.030)
pH: 6 (ref 5.0–8.0)

## 2019-06-25 MED ORDER — SODIUM CHLORIDE 0.9 % IV BOLUS
1000.0000 mL | Freq: Once | INTRAVENOUS | Status: AC
Start: 2019-06-25 — End: 2019-06-25
  Administered 2019-06-25: 1000 mL via INTRAVENOUS

## 2019-06-25 NOTE — ED Provider Notes (Signed)
[facility] DEPT Provider Note   CSN: [id number] Arrival date & time: 06/25/19  0258     History Chief Complaint  Patient presents with  . Dizziness    Julie Li is a 54 y.o. female.  Pt presents to the ED today with feeling lightheaded.  Pt said it happens when she stands up.  Pt does not take any current meds.  No f/c.  No cp or sob.  No n/v/d.        Past Medical History:  Diagnosis Date  . Carpal tunnel syndrome     Patient Active Problem List   Diagnosis Date Noted  . Carpal tunnel syndrome   . 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 UNDERWEIGHT 11/05/2007  . DENTAL CARIES 06/02/2007    History reviewed. No pertinent surgical history.   OB History   No obstetric history on file.     Family History  Problem Relation Age of Onset  . Hypertension Mother     Social History   Tobacco Use  . Smoking status: Never Smoker  . Smokeless tobacco: Never Used  Substance Use Topics  . Alcohol use: No  . Drug use: No    Home Medications Prior to Admission medications   Medication Sig Start Date End Date Taking? Authorizing Provider  aspirin-acetaminophen-caffeine (EXCEDRIN MIGRAINE) [phone] MG tablet Take 1 tablet by mouth every 6 (six) hours as needed for headache.   Yes [provider]  acetaminophen (TYLENOL) 500 MG tablet Take 2 tablets (1,000 mg total) by mouth every 6 (six) hours as needed. Patient not taking: Reported on 06/25/2019 05/24/18   Charlesetta Shanks, MD  amoxicillin (AMOXIL) 875 MG tablet Take 1 tablet (875 mg total) by mouth 2 (two) times daily. Patient not taking: Reported on 06/25/2019 02/09/19   Robyn Haber, MD    Allergies    Sulfonamide derivatives and Tramadol hcl  Review of Systems   Review of Systems  Neurological: Positive for weakness and light-headedness.  All other systems reviewed and are negative.   Physical Exam Updated Vital Signs BP 119/81   Pulse 81   Temp 98.8  F (37.1 C) (Oral)   Resp 17   Ht 5\' 2"  (1.575 m)   Wt 47.6 kg   LMP 10/16/2013   SpO2 100%   BMI 19.20 kg/m   Physical Exam Vitals and nursing note reviewed.  Constitutional:      Appearance: Normal appearance.  HENT:     Head: Normocephalic and atraumatic.     Right Ear: External ear normal.     Left Ear: External ear normal.     Nose: Nose normal.     Mouth/Throat:     Mouth: Mucous membranes are moist.     Pharynx: Oropharynx is clear.  Eyes:     Extraocular Movements: Extraocular movements intact.     Conjunctiva/sclera: Conjunctivae normal.     Pupils: Pupils are equal, round, and reactive to light.  Cardiovascular:     Rate and Rhythm: Normal rate and regular rhythm.     Pulses: Normal pulses.     Heart sounds: Normal heart sounds.  Pulmonary:     Effort: Pulmonary effort is normal.     Breath sounds: Normal breath sounds.  Abdominal:     General: Abdomen is flat. Bowel sounds are normal.     Palpations: Abdomen is soft.  Musculoskeletal:        General: Normal range of motion.     Cervical back: Normal  range of motion and neck supple.  Skin:    General: Skin is warm.     Capillary Refill: Capillary refill takes less than 2 seconds.  Neurological:     General: No focal deficit present.     Mental Status: She is alert and oriented to person, place, and time.  Psychiatric:        Mood and Affect: Mood normal.        Behavior: Behavior normal.        Thought Content: Thought content normal.        Judgment: Judgment normal.     ED Results / Procedures / Treatments   Labs (all labs ordered are listed, but only abnormal results are displayed) Labs Reviewed  COMPREHENSIVE METABOLIC PANEL - Abnormal; Notable for the following components:      Result Value   AST 12 (*)    All other components within normal limits  URINALYSIS, ROUTINE W REFLEX MICROSCOPIC - Abnormal; Notable for the following components:   Ketones, ur 5 (*)    Leukocytes,Ua MODERATE (*)      Bacteria, UA RARE (*)    All other components within normal limits  CBC WITH DIFFERENTIAL/PLATELET    EKG EKG Interpretation  Date/Time:  Thursday June 25 2019 07:35:14 EST Ventricular Rate:  82 PR Interval:    QRS Duration: 86 QT Interval:  355 QTC Calculation: 415 R Axis:   78 Text Interpretation: Sinus rhythm Short PR interval Abnormal R-wave progression, early transition No significant change since last tracing Confirmed by Jacalyn Lefevre [phone]) on 06/25/2019 7:47:35 AM   Radiology No results found.  Procedures Procedures (including critical care time)  Medications Ordered in ED Medications  sodium chloride 0.9 % bolus 1,000 mL (0 mLs Intravenous Stopped 06/25/19 0854)    ED Course  I have reviewed the triage vital signs and the nursing notes.  Pertinent labs & imaging results that were available during my care of the patient were reviewed by me and considered in my medical decision making (see chart for details).    MDM Rules/Calculators/A&P                      Pt is feeling better after some IVFs.  She is able to ambulate without difficulties.  Pt is stable for d/c.  Return if worse.  Final Clinical Impression(s) / ED Diagnoses Final diagnoses:  Dehydration    Rx / DC Orders ED Discharge Orders    None       Jacalyn Lefevre, MD 06/25/19 [phone]

## 2019-09-02 ENCOUNTER — Ambulatory Visit (INDEPENDENT_AMBULATORY_CARE_PROVIDER_SITE_OTHER)
Admission: EM | Admit: 2019-09-02 | Discharge: 2019-09-02 | Disposition: A | Discharge status: Home / Self Care | Payer: 59 | Source: Home / Self Care | Attending: Emergency Medicine | Admitting: Emergency Medicine

## 2019-09-02 ENCOUNTER — Emergency Department (HOSPITAL_COMMUNITY)
Admission: EM | Admit: 2019-09-02 | Discharge: 2019-09-02 | Disposition: A | Discharge status: Home / Self Care | Payer: 59 | Attending: Emergency Medicine | Admitting: Emergency Medicine

## 2019-09-02 ENCOUNTER — Encounter (HOSPITAL_COMMUNITY): Payer: Self-pay

## 2019-09-02 ENCOUNTER — Other Ambulatory Visit: Payer: Self-pay

## 2019-09-02 ENCOUNTER — Encounter (HOSPITAL_COMMUNITY): Payer: Self-pay | Admitting: Emergency Medicine

## 2019-09-02 ENCOUNTER — Ambulatory Visit (INDEPENDENT_AMBULATORY_CARE_PROVIDER_SITE_OTHER): Payer: 59

## 2019-09-02 DIAGNOSIS — Z882 Allergy status to sulfonamides status: Secondary | ICD-10-CM | POA: Insufficient documentation

## 2019-09-02 DIAGNOSIS — R519 Headache, unspecified: Secondary | ICD-10-CM | POA: Diagnosis not present

## 2019-09-02 DIAGNOSIS — Z20822 Contact with and (suspected) exposure to covid-19: Secondary | ICD-10-CM | POA: Insufficient documentation

## 2019-09-02 DIAGNOSIS — Z5321 Procedure and treatment not carried out due to patient leaving prior to being seen by health care provider: Secondary | ICD-10-CM | POA: Diagnosis not present

## 2019-09-02 DIAGNOSIS — N12 Tubulo-interstitial nephritis, not specified as acute or chronic: Secondary | ICD-10-CM

## 2019-09-02 DIAGNOSIS — Z885 Allergy status to narcotic agent status: Secondary | ICD-10-CM | POA: Insufficient documentation

## 2019-09-02 DIAGNOSIS — R509 Fever, unspecified: Secondary | ICD-10-CM | POA: Insufficient documentation

## 2019-09-02 DIAGNOSIS — Z791 Long term (current) use of non-steroidal anti-inflammatories (NSAID): Secondary | ICD-10-CM | POA: Insufficient documentation

## 2019-09-02 DIAGNOSIS — Z79899 Other long term (current) drug therapy: Secondary | ICD-10-CM | POA: Insufficient documentation

## 2019-09-02 LAB — COMPREHENSIVE METABOLIC PANEL
ALT: 13 U/L (ref 0–44)
AST: 15 U/L (ref 15–41)
Albumin: 4.1 g/dL (ref 3.5–5.0)
Alkaline Phosphatase: 108 U/L (ref 38–126)
Anion gap: 13 (ref 5–15)
BUN: 9 mg/dL (ref 6–20)
CO2: 25 mmol/L (ref 22–32)
Calcium: 9.4 mg/dL (ref 8.9–10.3)
Chloride: 100 mmol/L (ref 98–111)
Creatinine, Ser: 0.84 mg/dL (ref 0.44–1.00)
GFR calc Af Amer: >60 mL/min (ref >60)
GFR calc non Af Amer: >60 mL/min (ref >60)
Glucose, Bld: 91 mg/dL (ref 70–99)
Potassium: 4.1 mmol/L (ref 3.5–5.1)
Sodium: 138 mmol/L (ref 135–145)
Total Bilirubin: 1.2 mg/dL (ref 0.3–1.2)
Total Protein: 8.6 g/dL — ABNORMAL HIGH (ref 6.5–8.1)

## 2019-09-02 LAB — HEPATITIS PANEL, ACUTE
HCV Ab: NONREACTIVE
Hep A IgM: NONREACTIVE
Hep B C IgM: NONREACTIVE
Hepatitis B Surface Ag: NONREACTIVE

## 2019-09-02 LAB — CBC
HCT: 43.6 % (ref 36.0–46.0)
Hemoglobin: 14.3 g/dL (ref 12.0–15.0)
MCH: 30.6 pg (ref 26.0–34.0)
MCHC: 32.8 g/dL (ref 30.0–36.0)
MCV: 93.2 fL (ref 80.0–100.0)
Platelets: 361 10*3/uL (ref 150–400)
RBC: 4.68 MIL/uL (ref 3.87–5.11)
RDW: 13.4 % (ref 11.5–15.5)
WBC: 5.4 10*3/uL (ref 4.0–10.5)
nRBC: 0 % (ref 0.0–0.2)

## 2019-09-02 LAB — POCT URINALYSIS DIP (DEVICE)
Glucose, UA: NEGATIVE mg/dL
Ketones, ur: 15 mg/dL — AB
Nitrite: POSITIVE — AB
Protein, ur: 30 mg/dL — AB
Specific Gravity, Urine: 1.025 (ref 1.005–1.030)
Urobilinogen, UA: 4 mg/dL — ABNORMAL HIGH (ref 0.0–1.0)
pH: 6 (ref 5.0–8.0)

## 2019-09-02 MED ORDER — CIPROFLOXACIN HCL 500 MG PO TABS: 500.0000 mg | ORAL_TABLET | Freq: Two times a day (BID) | ORAL | 0 refills | Status: AC

## 2019-09-02 MED ORDER — CEFTRIAXONE SODIUM 1 G IJ SOLR
1.0000 g | Freq: Once | INTRAMUSCULAR | Status: AC
  Administered 2019-09-02: 1 g via INTRAMUSCULAR

## 2019-09-02 MED ORDER — LIDOCAINE HCL (PF) 1 % IJ SOLN
INTRAMUSCULAR | Status: AC
  Filled 2019-09-02: qty 2

## 2019-09-02 MED ORDER — CEFTRIAXONE SODIUM 1 G IJ SOLR
INTRAMUSCULAR | Status: AC
  Filled 2019-09-02: qty 10

## 2019-09-02 MED ORDER — IBUPROFEN 600 MG PO TABS: 600.0000 mg | ORAL_TABLET | Freq: Four times a day (QID) | ORAL | 0 refills | Status: DC | PRN

## 2019-09-02 NOTE — ED Triage Notes (Signed)
Pt reports headache and fever x 1 day (105.0 F), pt is taking Tylenol.

## 2019-09-02 NOTE — ED Provider Notes (Signed)
HPI  SUBJECTIVE:  Julie Li is a 54 y.o. female who presents with fevers T-max 105 starting yesterday.  She reports headaches, sore throat which resolved.  She reports a nonproductive cough.  She had her first 1 Moderna COVID vaccine over a month ago.  No body aches, nasal congestion, sinus pain or pressure, loss of smell or taste, wheezing, chest pain, shortness of breath.  No nausea, vomiting, diarrhea, abdominal pain.  No known exposure to Covid.  No photophobia, neck stiffness, rash.  No ear pain.  No back pain.  No urinary complaints.  No tick bite.  She tried 1000 mg of Tylenol twice today with some improvement in her symptoms.  Last dose of Tylenol was 1 hour prior to evaluation.  No aggravating factors.  Past medical history of seasonal allergies.  No history of UTI, pyelonephritis, diabetes, hypertension, coronary disease, immunocompromise, chronic kidney disease, pulmonary disease, smoking.    She does not drink alcohol or use IV drugs. PMD: None.   Past Medical History:  Diagnosis Date  . Carpal tunnel syndrome     History reviewed. No pertinent surgical history.  Family History  Problem Relation Age of Onset  . Hypertension Mother     Social History   Tobacco Use  . Smoking status: Never Smoker  . Smokeless tobacco: Never Used  Substance Use Topics  . Alcohol use: No  . Drug use: No    No current facility-administered medications for this encounter.  Current Outpatient Medications:  .  acetaminophen (TYLENOL) 500 MG tablet, Take 2 tablets (1,000 mg total) by mouth every 6 (six) hours as needed. (Patient not taking: Reported on 06/25/2019), Disp: 30 tablet, Rfl: 0 .  ciprofloxacin (CIPRO) 500 MG tablet, Take 1 tablet (500 mg total) by mouth 2 (two) times daily for 7 days., Disp: 14 tablet, Rfl: 0 .  ibuprofen (ADVIL) 600 MG tablet, Take 1 tablet (600 mg total) by mouth every 6 (six) hours as needed., Disp: 30 tablet, Rfl: 0  Allergies  Allergen Reactions  .  Sulfonamide Derivatives Itching  . Tramadol Hcl Nausea And Vomiting     ROS  As noted in HPI.   Physical Exam  BP 112/63 (BP Location: Left Arm)   Pulse 92   Temp 99.3 F (37.4 C) (Oral)   Resp 18   LMP 10/16/2013   SpO2 96%   Constitutional: Well developed, well nourished, no acute distress Eyes:  EOMI, conjunctiva normal bilaterally.  PERRLA, no photophobia. HENT: Normocephalic, atraumatic,mucus membranes moist.  No nasal congestion.  Normal turbinates.  No maxillary, frontal sinus tenderness.  Normal oropharynx. Neck: No cervical lymphadenopathy, meningismus. Respiratory: Normal inspiratory effort lungs clear bilaterally, good air movement. Cardiovascular: Normal rate regular rhythm, no murmurs rubs or gallops GI: Soft, nontender, nondistended.  No guarding, rebound.  Active bowel sounds.  Negative Murphy, negative McBurney. Back: No CVAT skin: No rash, skin intact Musculoskeletal: no deformities Neurologic: Alert & oriented x 3, no focal neuro deficits Psychiatric: Speech and behavior appropriate   ED Course   Medications  cefTRIAXone (ROCEPHIN) injection 1 g (1 g Intramuscular Given 09/02/19 1528)    Orders Placed This Encounter  Procedures  . SARS CORONAVIRUS 2 (TAT 6-24 HRS) Nasopharyngeal Nasopharyngeal Swab    Standing Status:   Standing    Number of Occurrences:   1    Order Specific Question:   Is this test for diagnosis or screening    Answer:   Screening    Order Specific Question:  Symptomatic for COVID-19 as defined by CDC    Answer:   No    Order Specific Question:   Hospitalized for COVID-19    Answer:   No    Order Specific Question:   Admitted to ICU for COVID-19    Answer:   No    Order Specific Question:   Previously tested for COVID-19    Answer:   No    Order Specific Question:   Resident in a congregate (group) care setting    Answer:   No    Order Specific Question:   Employed in healthcare setting    Answer:   No    Order Specific  Question:   Pregnant    Answer:   No    Order Specific Question:   Has patient completed COVID vaccination(s) (2 doses of Pfizer/Moderna 1 dose of [employer])    Answer:   No  . Urine culture    Standing Status:   Standing    Number of Occurrences:   1    Order Specific Question:   List patient's active antibiotics    Answer:   cipro  . DG Chest 2 View    Standing Status:   Standing    Number of Occurrences:   1    Order Specific Question:   Reason for Exam (SYMPTOM  OR DIAGNOSIS REQUIRED)    Answer:   fever cough r/o PNA  . Comprehensive metabolic panel    Standing Status:   Standing    Number of Occurrences:   1  . CBC    Standing Status:   Standing    Number of Occurrences:   1  . Hepatitis panel, acute    Standing Status:   Standing    Number of Occurrences:   1  . POCT urinalysis dip (device)    Standing Status:   Standing    Number of Occurrences:   1    Results for orders placed or performed during the hospital encounter of 09/02/19 (from the past 24 hour(s))  POCT urinalysis dip (device)     Status: Abnormal   Collection Time: 09/02/19  2:07 PM  Result Value Ref Range   Glucose, UA NEGATIVE NEGATIVE mg/dL   Bilirubin Urine SMALL (A) NEGATIVE   Ketones, ur 15 (A) NEGATIVE mg/dL   Specific Gravity, Urine 1.025 1.005 - 1.030   Hgb urine dipstick MODERATE (A) NEGATIVE   pH 6.0 5.0 - 8.0   Protein, ur 30 (A) NEGATIVE mg/dL   Urobilinogen, UA 4.0 (H) 0.0 - 1.0 mg/dL   Nitrite POSITIVE (A) NEGATIVE   Leukocytes,Ua TRACE (A) NEGATIVE  Comprehensive metabolic panel     Status: Abnormal   Collection Time: 09/02/19  3:15 PM  Result Value Ref Range   Sodium 138 135 - 145 mmol/L   Potassium 4.1 3.5 - 5.1 mmol/L   Chloride 100 98 - 111 mmol/L   CO2 25 22 - 32 mmol/L   Glucose, Bld 91 70 - 99 mg/dL   BUN 9 6 - 20 mg/dL   Creatinine, Ser 0.84 0.44 - 1.00 mg/dL   Calcium 9.4 8.9 - 10.3 mg/dL   Total Protein 8.6 (H) 6.5 - 8.1 g/dL   Albumin 4.1 3.5 - 5.0 g/dL   AST  15 15 - 41 U/L   ALT 13 0 - 44 U/L   Alkaline Phosphatase 108 38 - 126 U/L   Total Bilirubin 1.2 0.3 - 1.2 mg/dL   GFR  calc non Af Amer >60 >60 mL/min   GFR calc Af Amer >60 >60 mL/min   Anion gap 13 5 - 15  CBC     Status: None   Collection Time: 09/02/19  3:15 PM  Result Value Ref Range   WBC 5.4 4.0 - 10.5 K/uL   RBC 4.68 3.87 - 5.11 MIL/uL   Hemoglobin 14.3 12.0 - 15.0 g/dL   HCT 73.2 20.2 - 54.2 %   MCV 93.2 80.0 - 100.0 fL   MCH 30.6 26.0 - 34.0 pg   MCHC 32.8 30.0 - 36.0 g/dL   RDW 70.6 23.7 - 62.8 %   Platelets 361 150 - 400 K/uL   nRBC 0.0 0.0 - 0.2 %  Hepatitis panel, acute     Status: None   Collection Time: 09/02/19  3:15 PM  Result Value Ref Range   Hepatitis B Surface Ag NON REACTIVE NON REACTIVE   HCV Ab NON REACTIVE NON REACTIVE   Hep A IgM NON REACTIVE NON REACTIVE   Hep B C IgM NON REACTIVE NON REACTIVE   DG Chest 2 View  Result Date: 09/02/2019 CLINICAL DATA:  54 year old presenting with acute onset of fever and dry cough that began yesterday. Nonsmoker. EXAM: CHEST - 2 VIEW COMPARISON:  05/24/2018 and earlier. FINDINGS: Cardiomediastinal silhouette unremarkable and unchanged. Lungs clear. Bronchovascular markings normal. Pulmonary vascularity normal. No visible pleural effusions. No pneumothorax. Visualized bony thorax unremarkable apart from mild thoracolumbar dextroscoliosis which may be at least in part positional. IMPRESSION: No acute cardiopulmonary disease. Electronically Signed   By: Hulan Saas M.D.   On: 09/02/2019 14:39    ED Clinical Impression  1. Pyelonephritis      ED Assessment/Plan  Patient afebrile, but she took Tylenol an hour before coming in.  Appears nontoxic.  Will check a chest x-ray given history of fever and cough, also urine.  No evidence of sinusitis, otitis, meningitis, intra-abdominal process.  She does not recall a tick bite.  Covid PCR sent  UA consistent with a UTI.  Will send off for culture to confirm diagnosis and  antibiotic choice.  She has urobilinogen and bilirubin in her urine, but she  denies abdominal pain.  She has no tenderness over her abdomen.  Doubt obstructive jaundice, bile duct obstruction.  However will check CBC, CMP, hepatitis panel as she does not have a primary care physician.    Reviewed imaging independently.  Normal chest x-ray.  See radiology report for full details.  Given the fever with a UA, concern for pyelonephritis.  Giving 1 g of Rocephin here.  Will send home with ciprofloxacin 500 mg twice daily for 7 days.  She is allergic to sulfa.  She denies history of prolonged QT syndrome, arrhythmia.  Kidney function April 2021 normal.  Will Contact patient at [phone] only if labs are abnormal.    Hepatitis panel, CBC, CMP unremarkable. COVID negative  Home with Cipro, Tylenol/ibuprofen combination 3-4 times a day as needed for headache, fever.  Push fluids.  Will provide primary care list for ongoing care.  Strict ER return precautions.  Discussed labs, imaging, MDM, treatment plan, and plan for follow-up with patient. Discussed sn/sx that should prompt return to the ED. patient agrees with plan.   Meds ordered this encounter  Medications  . cefTRIAXone (ROCEPHIN) injection 1 g  . ciprofloxacin (CIPRO) 500 MG tablet    Sig: Take 1 tablet (500 mg total) by mouth 2 (two) times daily for 7 days.  Dispense:  14 tablet    Refill:  0  . ibuprofen (ADVIL) 600 MG tablet    Sig: Take 1 tablet (600 mg total) by mouth every 6 (six) hours as needed.    Dispense:  30 tablet    Refill:  0    *This clinic note was created using Scientist, clinical (histocompatibility and immunogenetics). Therefore, there may be occasional mistakes despite careful proofreading.   ?    Domenick Gong, MD 09/04/19 [phone]

## 2019-09-02 NOTE — Discharge Instructions (Addendum)
Push plenty of fluids.  You may take ibuprofen 600 mg combined with 1000 mg of Tylenol 3 or 4 times a day as needed for fever, headache.  Your Covid test will be back in 24 hours.  I have also sent off a CBC, comprehensive metabolic panel and a hepatitis panel.  I will contact you if we need to change your antibiotics or if I need to discuss your labs with you.  I am giving you 1 g of Rocephin here for a presumed kidney infection.  Go immediately to the emergency department if your symptoms get worse, you continue to have fevers despite being on the Tylenol/ibuprofen, or for any other concerns.  Below is a list of primary care practices who are taking new patients for you to follow-up with.  [facility] internal medicine clinic Ground Floor - [facility], [address] [place], [place], [place] [address] [phone]  [facility] at [facility] [address] [place], [place] [address] [phone]  [facility] [address] [place], [place] [address] [phone]  [facility] [phone] [address] [place] [address]  [facility]: [address] [place] [address]  [phone]  [facility]: [address] [place] [address]   [phone]  [facility]: [address] [place] [address]  [phone]  [facility] primary [facility] : [address] [place] [address] [phone]  [facility]: [address] [place] [address] [phone]  [facility]: [address] [place] [address] [phone]  Dr. Oneal Grout [address] [facility] [address]  [phone]  Dr. Jackie Plum, [facility]. [address] [place], [place] [address]  [phone]  Go to www.goodrx.com to look up your medications. This will give you a list of where you can find your prescriptions at the most affordable prices. Or ask the pharmacist what the cash price is, or if they have any other discount programs available to help make your medication more affordable. This can be less expensive than what you would pay with insurance.

## 2019-09-02 NOTE — ED Notes (Signed)
No answer from lobby x 2 

## 2019-09-02 NOTE — ED Notes (Signed)
Pt stated to this writer at when she woke up around 3 am and took her temperature it was 105.0. This Clinical research associate retook patient temperature. Temperature was 99.4!  Pt stated she took Tylenol PM before coming to ED

## 2019-09-02 NOTE — ED Triage Notes (Signed)
Patient presents stating she had a fever at home of 105 at 0300 and took tylenol. Afebrile on arrival. Also complains of headache.

## 2019-09-03 LAB — SARS CORONAVIRUS 2 (TAT 6-24 HRS): SARS Coronavirus 2: NEGATIVE

## 2019-09-04 LAB — URINE CULTURE: Culture: >=100000 — AB

## 2019-10-10 ENCOUNTER — Ambulatory Visit (HOSPITAL_COMMUNITY)
Admission: EM | Admit: 2019-10-10 | Discharge: 2019-10-10 | Disposition: A | Discharge status: Home / Self Care | Payer: 59 | Attending: Family Medicine | Admitting: Family Medicine

## 2019-10-10 ENCOUNTER — Encounter (HOSPITAL_COMMUNITY): Payer: Self-pay

## 2019-10-10 ENCOUNTER — Other Ambulatory Visit: Payer: Self-pay

## 2019-10-10 DIAGNOSIS — T148XXA Other injury of unspecified body region, initial encounter: Secondary | ICD-10-CM

## 2019-10-10 DIAGNOSIS — M545 Low back pain, unspecified: Secondary | ICD-10-CM

## 2019-10-10 MED ORDER — CYCLOBENZAPRINE HCL 10 MG PO TABS: 10.0000 mg | ORAL_TABLET | Freq: Two times a day (BID) | ORAL | 0 refills | Status: DC | PRN

## 2019-10-10 MED ORDER — IBUPROFEN 600 MG PO TABS: 600.0000 mg | ORAL_TABLET | Freq: Four times a day (QID) | ORAL | 0 refills | Status: DC | PRN

## 2019-10-10 NOTE — Discharge Instructions (Addendum)
Take the prescribed ibuprofen as needed for your pain.  Take the muscle relaxer Flexeril as needed for muscle spasm; do not drive, operate machinery, or drink alcohol with this medication as it may make you drowsy.    Follow up with an orthopedist if your pain is not improving.  Go to the emergency department if you have worsening pain or develop new symptoms such as difficulty with urination, weakness, numbness, loss of control of your bladder or bowels, fever, chills or other concerns.  

## 2019-10-10 NOTE — ED Triage Notes (Signed)
Pt presents for lower back pain today. Pt states she lifted a heavy object on Wednesday and back has been hurting since. Pt has full range of motion. Pt ambulated in to treatment space unassisted. Pt denies numbness or tingling in lower extremities.  Pt states pain is 4/10. Pt has been treating with Bengay cream with out relief.

## 2019-10-11 NOTE — ED Provider Notes (Signed)
[place]   [id number] 10/10/19 Arrival Time: 1331  WG:[id number] PAIN  SUBJECTIVE: History from: patient. Julie Li is a 54 y.o. female complains of bilateral low back pain that has been going on for 2 to 3 days. Denies taking over-the-counter medications for this. Reports that she lifts heavy things while she is at work and thinks that she may have pulled a muscle. Symptoms are aggravated with activity, denies radiating pain. Denies similar symptoms in the past.  Denies fever, chills, erythema, ecchymosis, effusion, weakness, numbness and tingling, saddle paresthesias, loss of bowel or bladder function.      ROS: As per HPI.  All other pertinent ROS negative.     Past Medical History:  Diagnosis Date  . Carpal tunnel syndrome    History reviewed. No pertinent surgical history. Allergies  Allergen Reactions  . Sulfonamide Derivatives Itching  . Tramadol Hcl Nausea And Vomiting   No current facility-administered medications on file prior to encounter.   Current Outpatient Medications on File Prior to Encounter  Medication Sig Dispense Refill  . acetaminophen (TYLENOL) 500 MG tablet Take 2 tablets (1,000 mg total) by mouth every 6 (six) hours as needed. 30 tablet 0   Social History   Socioeconomic History  . Marital status: Single    Spouse name: Not on file  . Number of children: Not on file  . Years of education: Not on file  . Highest education level: Not on file  Occupational History  . Not on file  Tobacco Use  . Smoking status: Never Smoker  . Smokeless tobacco: Never Used  Vaping Use  . Vaping Use: Never used  Substance and Sexual Activity  . Alcohol use: No  . Drug use: No  . Sexual activity: Not on file  Other Topics Concern  . Not on file  Social History Narrative  . Not on file   Social Determinants of Health   Financial Resource Strain:   . Difficulty of Paying Living Expenses:   Food Insecurity:   . Worried About Charity fundraiser  in the Last Year:   . Arboriculturist in the Last Year:   Transportation Needs:   . Film/video editor (Medical):   Marland Kitchen Lack of Transportation (Non-Medical):   Physical Activity:   . Days of Exercise per Week:   . Minutes of Exercise per Session:   Stress:   . Feeling of Stress :   Social Connections:   . Frequency of Communication with Friends and Family:   . Frequency of Social Gatherings with Friends and Family:   . Attends Religious Services:   . Active Member of Clubs or Organizations:   . Attends Archivist Meetings:   Marland Kitchen Marital Status:   Intimate Partner Violence:   . Fear of Current or Ex-Partner:   . Emotionally Abused:   Marland Kitchen Physically Abused:   . Sexually Abused:    Family History  Problem Relation Age of Onset  . Hypertension Mother     OBJECTIVE:  Vitals:   10/10/19 1350  BP: 114/73  Pulse: 88  Resp: 16  Temp: 98.5 F (36.9 C)  TempSrc: Oral  SpO2: 99%    General appearance: ALERT; in no acute distress.  Head: NCAT Lungs: Normal respiratory effort CV: pedal and radial pulses 2+ bilaterally. Cap refill < 2 seconds Musculoskeletal:  Inspection: Skin warm, dry, clear and intact without obvious erythema, effusion, or ecchymosis.  Palpation: tender to palpation ROM: FROM active and  passive, negative SLR Skin: warm and dry Neurologic: Ambulates without difficulty; Sensation intact about the upper/ lower extremities Psychological: alert and cooperative; normal mood and affect  DIAGNOSTIC STUDIES:  No results found.   ASSESSMENT & PLAN:  1. Acute bilateral low back pain without sciatica   2. Muscle strain     Meds ordered this encounter  Medications  . cyclobenzaprine (FLEXERIL) 10 MG tablet    Sig: Take 1 tablet (10 mg total) by mouth 2 (two) times daily as needed for muscle spasms.    Dispense:  20 tablet    Refill:  0    Order Specific Question:   Supervising Provider    Answer:   Merrilee Jansky [id number]  . ibuprofen (ADVIL)  600 MG tablet    Sig: Take 1 tablet (600 mg total) by mouth every 6 (six) hours as needed.    Dispense:  30 tablet    Refill:  0    Order Specific Question:   Supervising Provider    Answer:   Merrilee Jansky [id number]    Continue conservative management of rest, ice, and gentle stretches Take ibuprofen as needed for pain relief (may cause abdominal discomfort, ulcers, and GI bleeds avoid taking with other NSAIDs) Take cyclobenzaprine at nighttime for symptomatic relief. Avoid driving or operating heavy machinery while using medication. Follow up with PCP if symptoms persist Return or go to the ER if you have any new or worsening symptoms (fever, chills, chest pain, abdominal pain, changes in bowel or bladder habits, pain radiating into lower legs)   Reviewed expectations re: course of current medical issues. Questions answered. Outlined signs and symptoms indicating need for more acute intervention. Patient verbalized understanding. After Visit Summary given.       Moshe Cipro, NP 10/11/19 1238

## 2020-03-09 ENCOUNTER — Ambulatory Visit: Payer: 59

## 2020-03-09 ENCOUNTER — Other Ambulatory Visit: Payer: Self-pay

## 2020-03-09 DIAGNOSIS — Z1231 Encounter for screening mammogram for malignant neoplasm of breast: Secondary | ICD-10-CM

## 2020-10-16 ENCOUNTER — Other Ambulatory Visit: Payer: Self-pay

## 2020-10-16 ENCOUNTER — Encounter (HOSPITAL_COMMUNITY): Payer: Self-pay

## 2020-10-16 ENCOUNTER — Emergency Department (HOSPITAL_COMMUNITY)
Admission: EM | Admit: 2020-10-16 | Discharge: 2020-10-16 | Disposition: A | Discharge status: Home / Self Care | Payer: 59 | Attending: Emergency Medicine | Admitting: Emergency Medicine

## 2020-10-16 DIAGNOSIS — Z5321 Procedure and treatment not carried out due to patient leaving prior to being seen by health care provider: Secondary | ICD-10-CM | POA: Insufficient documentation

## 2020-10-16 DIAGNOSIS — M25561 Pain in right knee: Secondary | ICD-10-CM | POA: Insufficient documentation

## 2020-10-16 NOTE — ED Triage Notes (Signed)
Patient c/o pain behind her right knee and states slight swelling x 6 days.

## 2021-03-12 ENCOUNTER — Emergency Department (HOSPITAL_COMMUNITY)
Admission: EM | Admit: 2021-03-12 | Discharge: 2021-03-12 | Disposition: A | Discharge status: Home / Self Care | Payer: 59 | Attending: Emergency Medicine | Admitting: Emergency Medicine

## 2021-03-12 ENCOUNTER — Encounter (HOSPITAL_COMMUNITY): Payer: Self-pay | Admitting: Emergency Medicine

## 2021-03-12 DIAGNOSIS — M545 Low back pain, unspecified: Secondary | ICD-10-CM | POA: Diagnosis present

## 2021-03-12 MED ORDER — IBUPROFEN 800 MG PO TABS
800.0000 mg | ORAL_TABLET | Freq: Once | ORAL | Status: AC
  Administered 2021-03-12: 09:00:00 800 mg via ORAL
  Filled 2021-03-12: qty 1

## 2021-03-12 MED ORDER — METHOCARBAMOL 500 MG PO TABS: 1000.0000 mg | ORAL_TABLET | Freq: Two times a day (BID) | ORAL | 0 refills | Status: AC | PRN

## 2021-03-12 MED ORDER — LIDOCAINE 5 % EX PTCH: 1.0000 | MEDICATED_PATCH | Freq: Every day | CUTANEOUS | 0 refills | Status: DC | PRN

## 2021-03-12 MED ORDER — ACETAMINOPHEN 325 MG PO TABS: 650.0000 mg | ORAL_TABLET | Freq: Four times a day (QID) | ORAL | 0 refills | Status: AC | PRN

## 2021-03-12 MED ORDER — LIDOCAINE 5 % EX PTCH
1.0000 | MEDICATED_PATCH | CUTANEOUS | Status: DC
  Administered 2021-03-12: 09:00:00 1 via TRANSDERMAL
  Filled 2021-03-12: qty 1

## 2021-03-12 MED ORDER — ACETAMINOPHEN 325 MG PO TABS
650.0000 mg | ORAL_TABLET | Freq: Once | ORAL | Status: AC
  Administered 2021-03-12: 09:00:00 650 mg via ORAL
  Filled 2021-03-12: qty 2

## 2021-03-12 NOTE — Discharge Instructions (Addendum)
You may alternate Aleve with Acetaminophen (tylenol) every 4-6 hours as needed over the next few days for your back pain. Please follow up with your PCP in the next few days

## 2021-03-12 NOTE — ED Triage Notes (Signed)
PT c/o R lower back pain x2 days. States she thinks she pulled it lifting grandchild. Took aleve with some relief. Ambulatory.

## 2021-03-12 NOTE — ED Provider Notes (Signed)
[facility]-EMERGENCY DEPT Provider Note   CSN: [id number] Arrival date & time: 03/12/21  7342     History Chief Complaint  Patient presents with   Back Pain    Julie Li is a 55 y.o. female.  This is a 55 y.o. female with significant medical history as below, including carpal tunnel who presents to the ED with complaint of right sided low back pain. Pt reports 2 days ago she was picking up grand child and feels she pulled a muscle to her low back on the right. Pain is aching, pulling sensation that is not radiating, exacerbated by twisting of torso and picking up object. Mild relief with aleve yesterday and with topical cream (bengay?) No fevers, chills, n/v, change to bowel/bladder fxn, no saddle paresthesias, no urinary over flow or incontinence.  No numbness, tingling, headaches, neck pain.  No change to oral intake, no history of spinal injections or IV drug use.  No falls  The history is provided by the patient. No language interpreter was used.  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 No pertinent surgical history.   OB History   No obstetric history on file.     Family History  Problem Relation Age of Onset   Hypertension Mother     Social History   Tobacco Use   Smoking status: Never   Smokeless tobacco: Never  Vaping Use   Vaping Use: Never used  Substance Use Topics   Alcohol use: No   Drug use: No    Home Medications Prior to Admission medications   Medication Sig Start Date End Date Taking? Authorizing Provider  acetaminophen (TYLENOL) 325 MG tablet Take 2 tablets (650 mg total) by mouth every  6 (six) hours as needed. 03/12/21  Yes Tanda Rockers A, DO  lidocaine (LIDODERM) 5 % Place 1 patch onto the skin daily as needed. Remove & Discard patch within 12 hours or as directed by MD 03/12/21  Yes Sloan Leiter, DO  methocarbamol (ROBAXIN) 500 MG tablet Take 2 tablets (1,000 mg total) by mouth 2 (two) times daily as needed for muscle spasms. 03/12/21  Yes Tanda Rockers A, DO  Multiple Vitamins-Calcium (ONE-A-DAY WOMENS FORMULA PO) Take 1 tablet by mouth daily.   Yes [provider]  naproxen sodium (ALEVE) 220 MG tablet Take 440 mg by mouth daily as needed (pain).   Yes [provider]    Allergies    Sulfonamide derivatives and Tramadol hcl  Review of Systems   Review of Systems  Constitutional:  Negative for activity change and fever.  HENT:  Negative for facial swelling and trouble swallowing.   Eyes:  Negative for discharge and redness.  Respiratory:  Negative for cough and shortness of breath.   Cardiovascular:  Negative for chest pain and palpitations.  Gastrointestinal:  Negative for abdominal pain and nausea.  Genitourinary:  Negative for dysuria and flank pain.  Musculoskeletal:  Positive for back pain. Negative for gait problem.  Skin:  Negative for pallor and rash.  Neurological:  Negative for syncope and headaches.   Physical Exam Updated Vital  Signs BP 118/76 (BP Location: Left Arm)   Pulse 78   Temp 97.9 F (36.6 C) (Oral)   Resp 16   Ht 5\' 2"  (1.575 m)   Wt 49.4 kg   LMP 10/16/2013   SpO2 98%   BMI 19.94 kg/m   Physical Exam Vitals and nursing note reviewed.  Constitutional:      General: She is not in acute distress.    Appearance: Normal appearance.  HENT:     Head: Normocephalic and atraumatic.     Right Ear: External ear normal.     Left Ear: External ear normal.     Nose: Nose normal.     Mouth/Throat:     Mouth: Mucous membranes are moist.  Eyes:     General: No scleral icterus.       Right eye: No discharge.        Left  eye: No discharge.     Extraocular Movements: Extraocular movements intact.     Pupils: Pupils are equal, round, and reactive to light.  Cardiovascular:     Rate and Rhythm: Normal rate and regular rhythm.     Pulses: Normal pulses.     Heart sounds: Normal heart sounds.  Pulmonary:     Effort: Pulmonary effort is normal. No respiratory distress.     Breath sounds: Normal breath sounds.  Abdominal:     General: Abdomen is flat.     Tenderness: There is no abdominal tenderness.  Musculoskeletal:        General: Normal range of motion.       Arms:     Cervical back: Normal range of motion.     Right lower leg: No edema.     Left lower leg: No edema.  Skin:    General: Skin is warm and dry.     Capillary Refill: Capillary refill takes less than 2 seconds.  Neurological:     Mental Status: She is alert and oriented to person, place, and time.     GCS: GCS eye subscore is 4. GCS verbal subscore is 5. GCS motor subscore is 6.     Cranial Nerves: Cranial nerves 2-12 are intact. No facial asymmetry.     Sensory: Sensation is intact.     Motor: Motor function is intact. No weakness or tremor.     Coordination: Coordination is intact.     Gait: Gait is intact. Gait normal.  Psychiatric:        Mood and Affect: Mood normal.        Behavior: Behavior normal.    ED Results / Procedures / Treatments   Labs (all labs ordered are listed, but only abnormal results are displayed) Labs Reviewed - No data to display  EKG None  Radiology No results found.  Procedures Procedures   Medications Ordered in ED Medications  lidocaine (LIDODERM) 5 % 1 patch (has no administration in time range)  ibuprofen (ADVIL) tablet 800 mg (has no administration in time range)  acetaminophen (TYLENOL) tablet 650 mg (has no administration in time range)    ED Course  I have reviewed the triage vital signs and the nursing notes.  Pertinent labs & imaging results that were available during my care of  the patient were reviewed by me and considered in my medical decision making (see chart for details).    MDM Rules/Calculators/A&P  Patient presents with low back pain without signs of spinal cord compression, cauda equina syndrome, infection, aneurysm, or other serious etiology. Vital signs reviewed and are stable, serious etiology was considered. The patient is neurologically intact. Given the extremely low risk of these diagnoses further testing and evaluation for these possibilities does not appear to be indicated at this time. Detailed discussions were had with the patient and/or family and caregivers, regarding current findings, and need for close f/u with PCP or on call doctor. The patient has been instructed to return immediately if the symptoms worsen in any way. Patient verbalized understanding and is in agreement with current care plan. All questions answered prior to discharge.        This chart was dictated using voice recognition software.  Despite best efforts to proofread,  errors can occur which can change the documentation meaning.  Final Clinical Impression(s) / ED Diagnoses Final diagnoses:  Acute right-sided low back pain without sciatica    Rx / DC Orders ED Discharge Orders          Ordered    acetaminophen (TYLENOL) 325 MG tablet  Every 6 hours PRN        03/12/21 0833    methocarbamol (ROBAXIN) 500 MG tablet  2 times daily PRN        03/12/21 0833    lidocaine (LIDODERM) 5 %  Daily PRN        03/12/21 0833             Sloan Leiter, DO 03/12/21 [phone]

## 2021-09-02 ENCOUNTER — Encounter (HOSPITAL_COMMUNITY): Payer: Self-pay | Admitting: Emergency Medicine

## 2021-09-02 ENCOUNTER — Emergency Department (HOSPITAL_COMMUNITY): Payer: Self-pay

## 2021-09-02 ENCOUNTER — Other Ambulatory Visit: Payer: Self-pay

## 2021-09-02 ENCOUNTER — Emergency Department (HOSPITAL_COMMUNITY)
Admission: EM | Admit: 2021-09-02 | Discharge: 2021-09-02 | Disposition: A | Discharge status: Home / Self Care | Payer: Self-pay | Attending: Emergency Medicine | Admitting: Emergency Medicine

## 2021-09-02 DIAGNOSIS — J069 Acute upper respiratory infection, unspecified: Secondary | ICD-10-CM | POA: Insufficient documentation

## 2021-09-02 DIAGNOSIS — Z20822 Contact with and (suspected) exposure to covid-19: Secondary | ICD-10-CM | POA: Insufficient documentation

## 2021-09-02 LAB — RESP PANEL BY RT-PCR (FLU A&B, COVID) ARPGX2
Influenza A by PCR: NEGATIVE
Influenza B by PCR: NEGATIVE
SARS Coronavirus 2 by RT PCR: NEGATIVE

## 2021-09-02 MED ORDER — BENZONATATE 100 MG PO CAPS: 100.0000 mg | ORAL_CAPSULE | Freq: Three times a day (TID) | ORAL | 0 refills | Status: AC

## 2021-09-02 MED ORDER — BENZONATATE 100 MG PO CAPS
100.0000 mg | ORAL_CAPSULE | Freq: Once | ORAL | Status: AC
  Administered 2021-09-02: 100 mg via ORAL
  Filled 2021-09-02: qty 1

## 2021-09-02 NOTE — ED Triage Notes (Signed)
Pt reports cough x 1 week. Pt reports coughing up mucus. OTC meds are not working

## 2021-09-02 NOTE — Discharge Instructions (Signed)
Take the [facility] every 8 hours as needed for cough. Expect you have a viral infection, no signs of a bacterial infection on the x-ray.

## 2021-09-02 NOTE — ED Provider Notes (Signed)
[facility]-EMERGENCY DEPT Provider Note   CSN: [id number] Arrival date & time: 09/02/21  1116     History  Chief Complaint  Patient presents with   Cough    Julie Li is a 56 y.o. female.   Cough  Patient presents with cough x1 week.  Sometimes productive with white sputum, normally is dry and nonproductive.  Associate with nasal congestion, mucus and intermittent headaches.  Denies any chest pain or feeling short of breath.  She tried Mucinex with minimal relief.  Home Medications Prior to Admission medications   Medication Sig Start Date End Date Taking? Authorizing Provider  benzonatate (TESSALON) 100 MG capsule Take 1 capsule (100 mg total) by mouth every 8 (eight) hours. 09/02/21  Yes Theron Arista, PA-C  acetaminophen (TYLENOL) 325 MG tablet Take 2 tablets (650 mg total) by mouth every 6 (six) hours as needed. 03/12/21   Tanda Rockers A, DO  lidocaine (LIDODERM) 5 % Place 1 patch onto the skin daily as needed. Remove & Discard patch within 12 hours or as directed by MD 03/12/21   Sloan Leiter, DO  methocarbamol (ROBAXIN) 500 MG tablet Take 2 tablets (1,000 mg total) by mouth 2 (two) times daily as needed for muscle spasms. 03/12/21   Sloan Leiter, DO  Multiple Vitamins-Calcium (ONE-A-DAY WOMENS FORMULA PO) Take 1 tablet by mouth daily.    [provider]  naproxen sodium (ALEVE) 220 MG tablet Take 440 mg by mouth daily as needed (pain).    [provider]      Allergies    Sulfonamide derivatives and Tramadol hcl    Review of Systems   Review of Systems  Respiratory:  Positive for cough.    Physical Exam Updated Vital Signs BP 124/86 (BP Location: Left Arm)   Pulse 93   Temp 99.9 F (37.7 C) (Oral)   Resp 16   LMP 10/16/2013   SpO2 100%  Physical Exam Vitals and nursing note reviewed. Exam conducted with a chaperone present.  Constitutional:      Appearance: Normal appearance.  HENT:     Head: Normocephalic and  atraumatic.     Nose: Congestion present.  Eyes:     General: No scleral icterus.       Right eye: No discharge.        Left eye: No discharge.     Extraocular Movements: Extraocular movements intact.     Pupils: Pupils are equal, round, and reactive to light.  Cardiovascular:     Rate and Rhythm: Normal rate and regular rhythm.     Pulses: Normal pulses.     Heart sounds: Normal heart sounds. No murmur heard.   No friction rub. No gallop.  Pulmonary:     Effort: Pulmonary effort is normal. No respiratory distress.     Breath sounds: Normal breath sounds.     Comments: Speaking complete sentences, lungs are clear to auscultation bilaterally Abdominal:     General: Abdomen is flat. Bowel sounds are normal. There is no distension.     Palpations: Abdomen is soft.     Tenderness: There is no abdominal tenderness.  Skin:    General: Skin is warm and dry.     Coloration: Skin is not jaundiced.  Neurological:     Mental Status: She is alert. Mental status is at baseline.     Coordination: Coordination normal.    ED Results / Procedures / Treatments   Labs (all labs ordered are listed,  but only abnormal results are displayed) Labs Reviewed  RESP PANEL BY RT-PCR (FLU A&B, COVID) [id number]    EKG None  Radiology DG Chest 2 View  Result Date: 09/02/2021 CLINICAL DATA:  56 year old female with history of productive cough for 1 week. EXAM: CHEST - 2 VIEW COMPARISON:  Chest x-ray 09/02/2019. FINDINGS: Lung volumes are normal. No consolidative airspace disease. No pleural effusions. No pneumothorax. No pulmonary nodule or mass noted. Pulmonary vasculature and the cardiomediastinal silhouette are within normal limits. IMPRESSION: No radiographic evidence of acute cardiopulmonary disease. Electronically Signed   By: Trudie Reed M.D.   On: 09/02/2021 12:04    Procedures Procedures    Medications Ordered in ED Medications  benzonatate (TESSALON) capsule 100 mg (100 mg Oral Given  09/02/21 1151)    ED Course/ Medical Decision Making/ A&P                           Medical Decision Making Amount and/or Complexity of Data Reviewed Radiology: ordered.  Risk Prescription drug management.   Patient presents with cough x1 month.  Differential diagnosis includes not limited to URI, pneumonia, bronchitis, bronchiolitis.  On exam lungs are clear to auscultation bilaterally, not hypoxic or tachypneic.  Her temperature is elevated 99.9 but not technically a fever.  Ordered a chest x-ray, I viewed it and agree with radiologist interpretation.  There does not appear to be any acute process.  I ordered the patient Jerilynn Som which improved her coughing.  Considered further work-up but given stable vitals, significant improvement in that the symptoms seem more consistent with a viral URI with a cough I do not feel additional work-up is needed at this time.  Patient was discharged in stable condition.        Final Clinical Impression(s) / ED Diagnoses Final diagnoses:  Viral URI with cough    Rx / DC Orders ED Discharge Orders          Ordered    benzonatate (TESSALON) 100 MG capsule  Every 8 hours        09/02/21 1254              Theron Arista, PA-C 09/02/21 1302    Benjiman Core, MD 09/02/21 [phone]

## 2021-11-10 ENCOUNTER — Encounter (HOSPITAL_COMMUNITY): Payer: Self-pay | Admitting: Emergency Medicine

## 2021-11-10 ENCOUNTER — Emergency Department (HOSPITAL_COMMUNITY)
Admission: EM | Admit: 2021-11-10 | Discharge: 2021-11-10 | Disposition: A | Discharge status: Home / Self Care | Payer: Self-pay | Attending: Emergency Medicine | Admitting: Emergency Medicine

## 2021-11-10 ENCOUNTER — Emergency Department (HOSPITAL_COMMUNITY): Payer: Self-pay

## 2021-11-10 DIAGNOSIS — M545 Low back pain, unspecified: Secondary | ICD-10-CM | POA: Diagnosis present

## 2021-11-10 DIAGNOSIS — Y92410 Unspecified street and highway as the place of occurrence of the external cause: Secondary | ICD-10-CM | POA: Insufficient documentation

## 2021-11-10 MED ORDER — LIDOCAINE 5 % EX PTCH
1.0000 | MEDICATED_PATCH | CUTANEOUS | Status: DC
  Administered 2021-11-10: 1 via TRANSDERMAL
  Filled 2021-11-10: qty 1

## 2021-11-10 MED ORDER — IBUPROFEN 600 MG PO TABS: 600.0000 mg | ORAL_TABLET | Freq: Four times a day (QID) | ORAL | 0 refills | Status: AC | PRN

## 2021-11-10 MED ORDER — LIDOCAINE 5 % EX PTCH: 1.0000 | MEDICATED_PATCH | CUTANEOUS | 0 refills | Status: AC

## 2021-11-10 MED ORDER — IBUPROFEN 200 MG PO TABS
600.0000 mg | ORAL_TABLET | Freq: Once | ORAL | Status: AC
  Administered 2021-11-10: 600 mg via ORAL
  Filled 2021-11-10: qty 3
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
## 2021-11-10 NOTE — Discharge Instructions (Addendum)
Your work-up today was overall negative for any fracture.  We will treat this pain as muscular pain given where you are tender on exam.  Take ibuprofen as prescribed as needed for pain; note that this medication is in the same family as your naproxen you have taken in the past at home so please do not take both medications at the same time..  I also prescribed Lidoderm patches to use as needed for pain.  Note the symptoms of pharmacy charges a lot for the Lidoderm patches, but there is an over-the-counter version called Salonpas that works just as well if they are very expensive and your insurance does not cover them.  Follow-up with your primary care provider in 3 to 5 days for reevaluation.  Please do not hesitate to return to the emergency department for worrisome signs symptoms we discussed become apparent.
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
## 2022-07-30 ENCOUNTER — Encounter (HOSPITAL_COMMUNITY): Payer: Self-pay

## 2022-07-30 ENCOUNTER — Emergency Department (HOSPITAL_COMMUNITY)
Admission: EM | Admit: 2022-07-30 | Discharge: 2022-07-30 | Disposition: A | Discharge status: Home / Self Care | Payer: Medicaid Other | Attending: Emergency Medicine | Admitting: Emergency Medicine

## 2022-07-30 DIAGNOSIS — M791 Myalgia, unspecified site: Secondary | ICD-10-CM | POA: Insufficient documentation

## 2022-07-30 MED ORDER — ACETAMINOPHEN 500 MG PO TABS
1000.0000 mg | ORAL_TABLET | Freq: Once | ORAL | Status: AC
  Administered 2022-07-30: 1000 mg via ORAL
  Filled 2022-07-30: qty 2

## 2022-07-30 NOTE — ED Provider Notes (Signed)
[place] EMERGENCY DEPARTMENT AT [facility] Provider Note   CSN: [id number] Arrival date & time: 07/30/22  1143     History  Chief Complaint  Patient presents with   Generalized Body Aches    Julie Li is a 57 y.o. female.  57 year old female with prior history as detailed below presents for evaluation.  Patient reports that she developed diffuse bodyaches and mild congestion yesterday.  She feels like this is her allergies acting up.  She reports in the past she will feel like this for 2 or 3 days and then feel better.    She felt poorly enough today that she did not want to go to work.  She is requesting a note for work.  The history is provided by the patient and medical records.       Home Medications Prior to Admission medications   Medication Sig Start Date End Date Taking? Authorizing Provider  acetaminophen (TYLENOL) 325 MG tablet Take 2 tablets (650 mg total) by mouth every 6 (six) hours as needed. 03/12/21   Sloan Leiter, DO  benzonatate (TESSALON) 100 MG capsule Take 1 capsule (100 mg total) by mouth every 8 (eight) hours. 09/02/21   Theron Arista, PA-C  ibuprofen (ADVIL) 600 MG tablet Take 1 tablet (600 mg total) by mouth every 6 (six) hours as needed. 11/10/21   Sherian Maroon A, PA  lidocaine (LIDODERM) 5 % Place 1 patch onto the skin daily. Remove & Discard patch within 12 hours or as directed by MD 11/10/21   Damarcus Reggio Garter, PA  methocarbamol (ROBAXIN) 500 MG tablet Take 2 tablets (1,000 mg total) by mouth 2 (two) times daily as needed for muscle spasms. 03/12/21   Sloan Leiter, DO  Multiple Vitamins-Calcium (ONE-A-DAY WOMENS FORMULA PO) Take 1 tablet by mouth daily.    [provider]  naproxen sodium (ALEVE) 220 MG tablet Take 440 mg by mouth daily as needed (pain).    [provider]      Allergies    Sulfonamide derivatives and Tramadol hcl    Review of Systems   Review of Systems  All other systems reviewed  and are negative.   Physical Exam Updated Vital Signs BP 111/69 (BP Location: Right Arm)   Pulse (!) 102   Temp 99.5 F (37.5 C) (Oral)   Resp 16   Ht 5\' 2"  (1.575 m)   Wt 50.8 kg   LMP 10/16/2013   SpO2 100%   BMI 20.49 kg/m  Physical Exam Vitals and nursing note reviewed.  Constitutional:      General: She is not in acute distress.    Appearance: Normal appearance. She is well-developed.  HENT:     Head: Normocephalic and atraumatic.  Eyes:     Conjunctiva/sclera: Conjunctivae normal.     Pupils: Pupils are equal, round, and reactive to light.  Cardiovascular:     Rate and Rhythm: Normal rate and regular rhythm.     Heart sounds: Normal heart sounds.  Pulmonary:     Effort: Pulmonary effort is normal. No respiratory distress.     Breath sounds: Normal breath sounds.  Abdominal:     General: There is no distension.     Palpations: Abdomen is soft.     Tenderness: There is no abdominal tenderness.  Musculoskeletal:        General: No deformity. Normal range of motion.     Cervical back: Normal range of motion and neck supple.  Skin:  General: Skin is warm and dry.  Neurological:     General: No focal deficit present.     Mental Status: She is alert and oriented to person, place, and time. Mental status is at baseline.     ED Results / Procedures / Treatments   Labs (all labs ordered are listed, but only abnormal results are displayed) Labs Reviewed - No data to display  EKG None  Radiology No results found.  Procedures Procedures    Medications Ordered in ED Medications - No data to display  ED Course/ Medical Decision Making/ A&P                             Medical Decision Making   Medical Screen Complete  This patient presented to the ED with complaint of myalgia.  This complaint involves an extensive number of treatment options. The initial differential diagnosis includes, but is not limited to, viral syndrome, etc  This presentation  is: Acute, Self-Limited, Previously Undiagnosed, Uncertain Prognosis, and Complicated  Patient is presenting with complaint of mild myalgia and associated congestion.  Patient feels that this may be secondary to history of allergies.  Patient is requesting a work note.  She declines additional workup.  She is otherwise minimally symptomatic.  She is advised that her symptoms could be related to viral syndrome.  However, patient is comfortable with plan for symptomatic management at home.  She again declines additional ED evaluation.  Importance of close follow-up stressed.  Strict return precautions given and understood.  Additional history obtained:  External records from outside sources obtained and reviewed including prior ED visits and prior Inpatient records.   Problem List / ED Course:  Myalgia   Reevaluation:  After the interventions noted above, I reevaluated the patient and found that they have: improved  Disposition:  After consideration of the diagnostic results and the patients response to treatment, I feel that the patent would benefit from close outpatient followup.          Final Clinical Impression(s) / ED Diagnoses Final diagnoses:  Myalgia    Rx / DC Orders ED Discharge Orders     None         Wynetta Fines, MD 07/30/22 1239

## 2022-07-30 NOTE — ED Triage Notes (Signed)
Pt c/o generalized body aches. Pt denies fever or headache. No sick contacts.

## 2022-07-30 NOTE — Discharge Instructions (Signed)
Return for any problem.  ?

## 2022-07-31 ENCOUNTER — Ambulatory Visit (INDEPENDENT_AMBULATORY_CARE_PROVIDER_SITE_OTHER): Payer: Medicaid Other | Admitting: Primary Care

## 2022-09-25 IMAGING — CR DG CHEST 2V
2 series · 2 of 2 positions shown · non-contrast
Comparison: Chest x-ray 09/02/2019.

CLINICAL DATA: 55-year-old female with history of productive cough
for 1 week.

EXAM:
CHEST - 2 VIEW

[w chest pa]
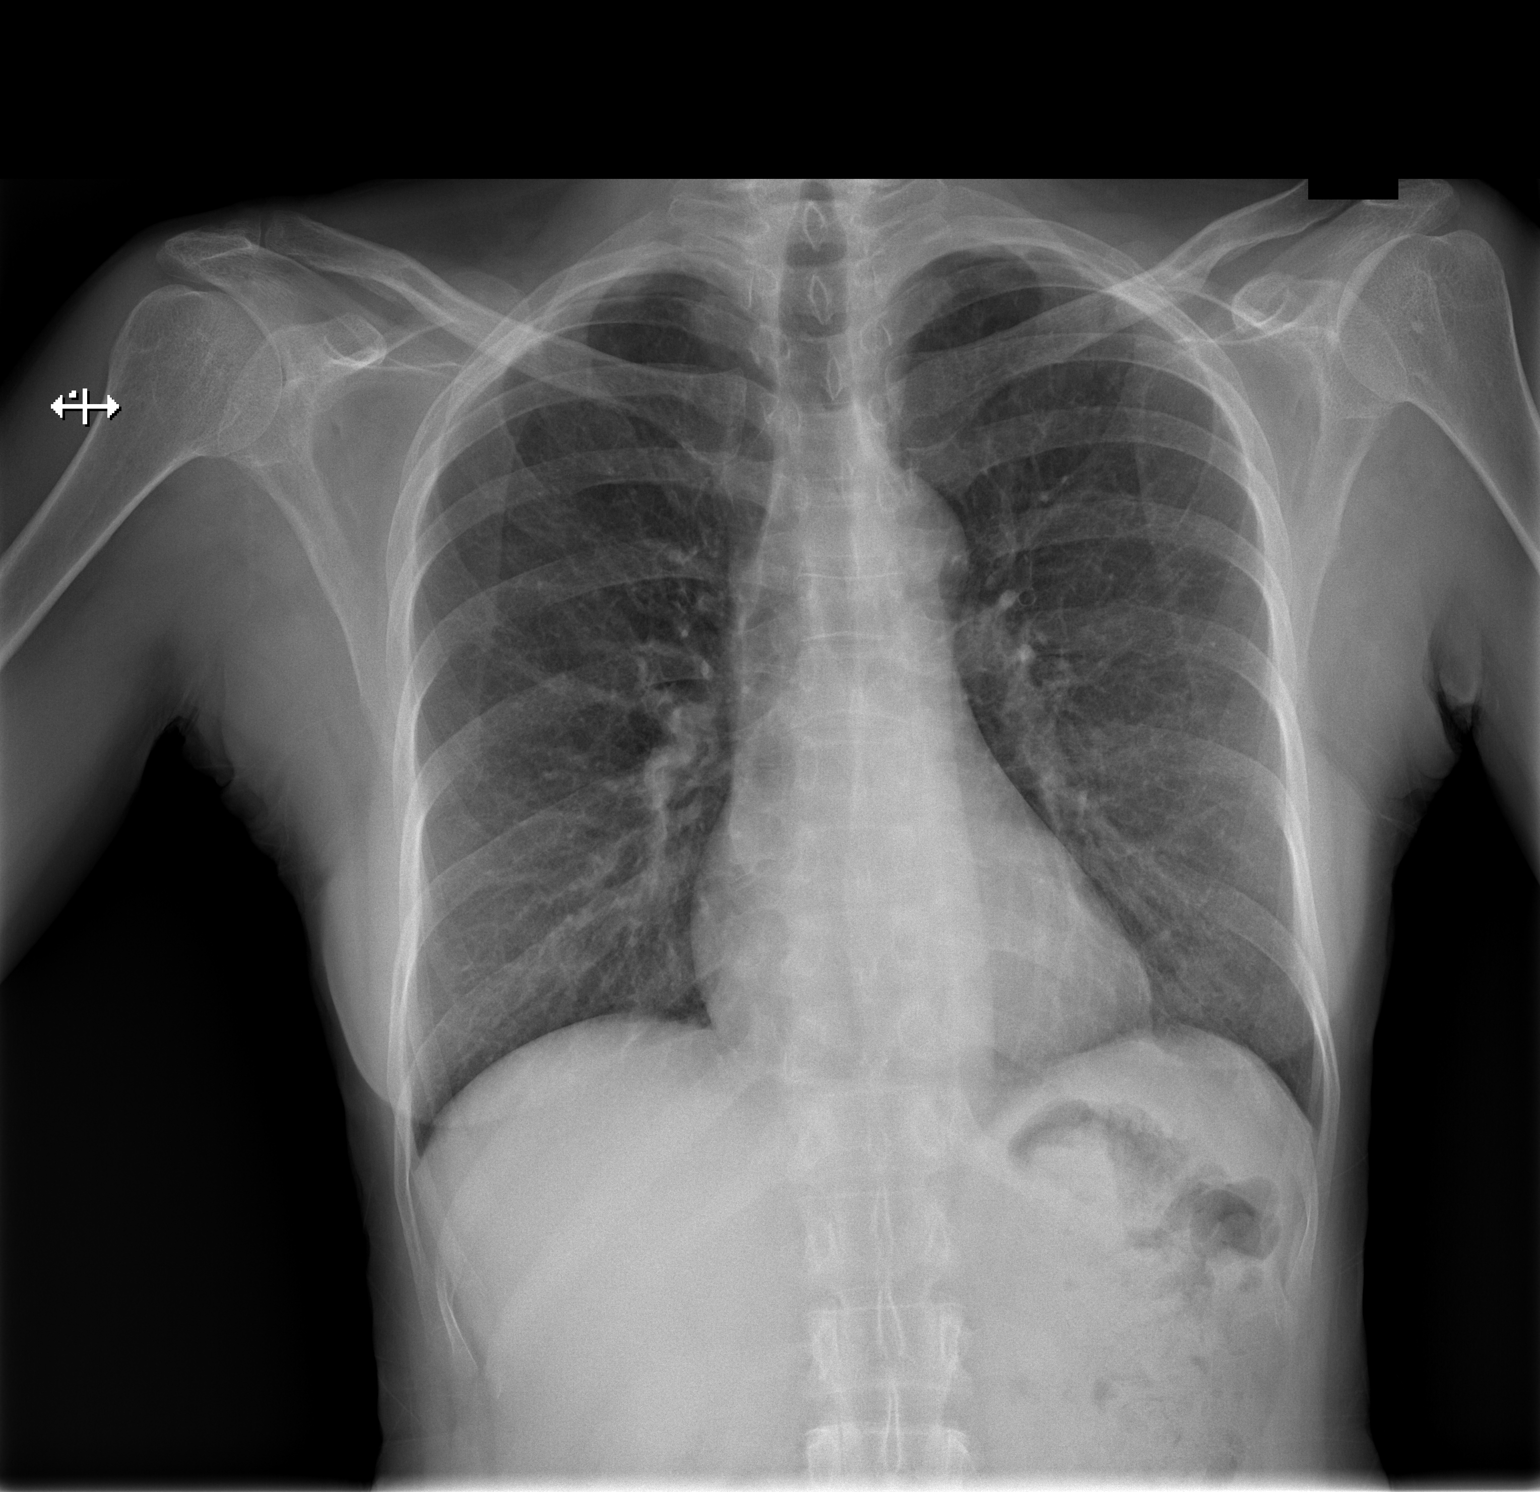

[w chest lat]
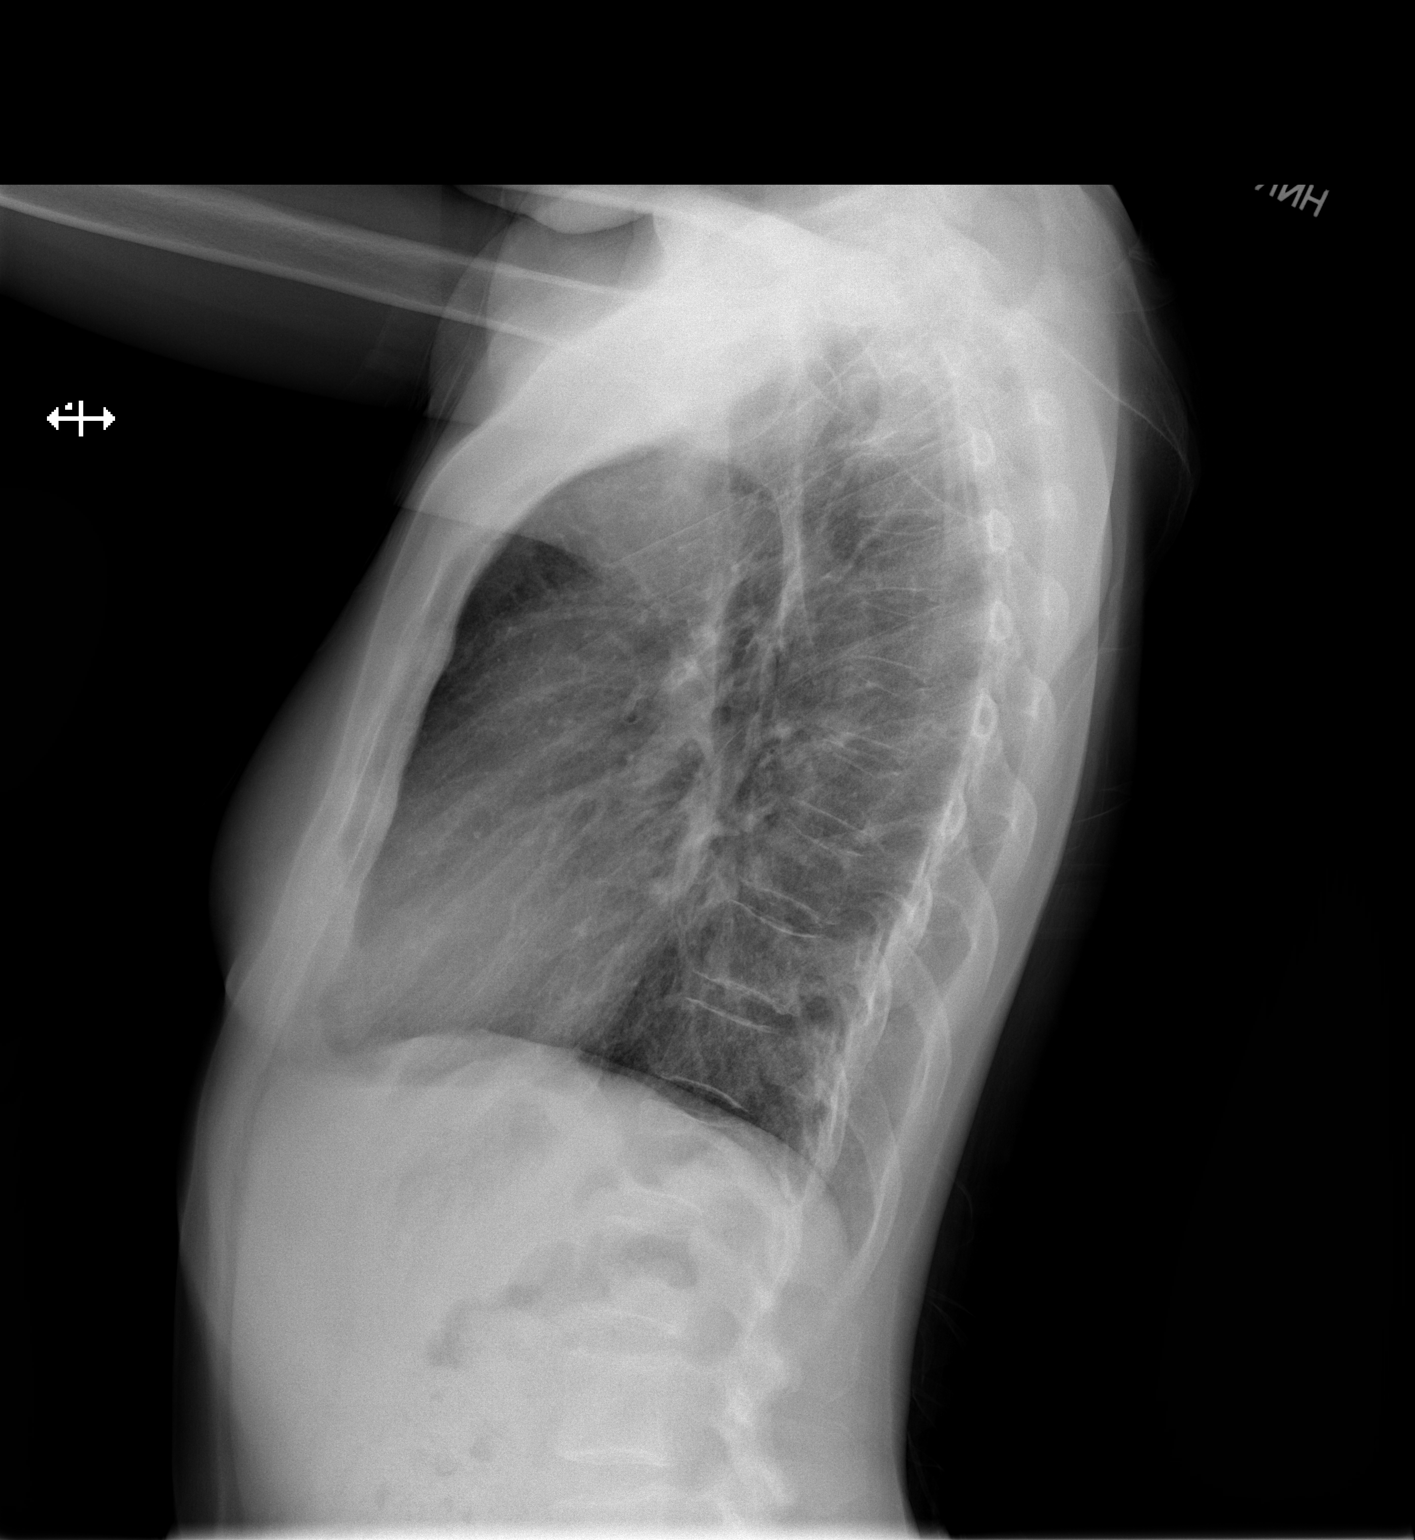

[2 of 2 positions shown; findings below may reference images not displayed]

FINDINGS: Lung volumes are normal. No consolidative airspace disease. No
pleural effusions. No pneumothorax. No pulmonary nodule or mass
noted. Pulmonary vasculature and the cardiomediastinal silhouette
are within normal limits.
IMPRESSION: No radiographic evidence of acute cardiopulmonary disease.

## 2022-12-08 ENCOUNTER — Encounter (HOSPITAL_COMMUNITY): Payer: Self-pay

## 2022-12-08 ENCOUNTER — Other Ambulatory Visit: Payer: Self-pay

## 2022-12-08 ENCOUNTER — Emergency Department (HOSPITAL_COMMUNITY)
Admission: EM | Admit: 2022-12-08 | Discharge: 2022-12-08 | Disposition: A | Discharge status: Home / Self Care | Payer: Medicaid Other | Source: Home / Self Care | Attending: Emergency Medicine | Admitting: Emergency Medicine

## 2022-12-08 DIAGNOSIS — K047 Periapical abscess without sinus: Secondary | ICD-10-CM | POA: Insufficient documentation

## 2022-12-08 DIAGNOSIS — K0889 Other specified disorders of teeth and supporting structures: Secondary | ICD-10-CM | POA: Diagnosis present

## 2022-12-08 MED ORDER — AMOXICILLIN 500 MG PO CAPS: 500.0000 mg | ORAL_CAPSULE | Freq: Three times a day (TID) | ORAL | 0 refills | Status: AC

## 2022-12-08 MED ORDER — NAPROXEN 500 MG PO TABS: 500.0000 mg | ORAL_TABLET | Freq: Two times a day (BID) | ORAL | 0 refills | Status: AC

## 2022-12-08 NOTE — Discharge Instructions (Signed)
Return if any problems.

## 2022-12-08 NOTE — ED Triage Notes (Signed)
Pt coming in today complaining of a tooth ache that began last week. States it in the top right portion of her jaw.

## 2022-12-08 NOTE — ED Provider Notes (Signed)
[place] EMERGENCY DEPARTMENT AT [facility] Provider Note   CSN: [id number] Arrival date & time: 12/08/22  1345     History  Chief Complaint  Patient presents with   Dental Pain    Julie Li is a 57 y.o. female.  Pt complains of dental pain.  Pt reports she does not have a dentist.    The history is provided by the patient. No language interpreter was used.  Dental Pain Location:  Upper Quality:  Aching Severity:  Moderate Onset quality:  Sudden Duration:  1 week Timing:  Constant Progression:  Worsening Chronicity:  New Previous work-up:  Dental exam Worsened by:  Nothing Associated symptoms: facial pain        Home Medications Prior to Admission medications   Medication Sig Start Date End Date Taking? Authorizing Provider  amoxicillin (AMOXIL) 500 MG capsule Take 1 capsule (500 mg total) by mouth 3 (three) times daily. 12/08/22  Yes Cheron Schaumann K, PA-C  naproxen (NAPROSYN) 500 MG tablet Take 1 tablet (500 mg total) by mouth 2 (two) times daily with a meal. 12/08/22 12/08/23 Yes Elson Areas, PA-C  acetaminophen (TYLENOL) 325 MG tablet Take 2 tablets (650 mg total) by mouth every 6 (six) hours as needed. 03/12/21   Sloan Leiter, DO  benzonatate (TESSALON) 100 MG capsule Take 1 capsule (100 mg total) by mouth every 8 (eight) hours. 09/02/21   Theron Arista, PA-C  ibuprofen (ADVIL) 600 MG tablet Take 1 tablet (600 mg total) by mouth every 6 (six) hours as needed. 11/10/21   Sherian Maroon A, PA  lidocaine (LIDODERM) 5 % Place 1 patch onto the skin daily. Remove & Discard patch within 12 hours or as directed by MD 11/10/21   Peter Garter, PA  methocarbamol (ROBAXIN) 500 MG tablet Take 2 tablets (1,000 mg total) by mouth 2 (two) times daily as needed for muscle spasms. 03/12/21   Sloan Leiter, DO  Multiple Vitamins-Calcium (ONE-A-DAY WOMENS FORMULA PO) Take 1 tablet by mouth daily.    [provider]  naproxen sodium (ALEVE) 220 MG  tablet Take 440 mg by mouth daily as needed (pain).    [provider]      Allergies    Sulfonamide derivatives and Tramadol hcl    Review of Systems   Review of Systems  All other systems reviewed and are negative.   Physical Exam Updated Vital Signs BP 127/89 (BP Location: Right Arm)   Pulse 75   Temp 98.1 F (36.7 C) (Oral)   Resp 16   Ht 5\' 2"  (1.575 m)   Wt 47.6 kg   LMP 10/16/2013   SpO2 100%   BMI 19.20 kg/m  Physical Exam Vitals and nursing note reviewed.  Constitutional:      General: She is not in acute distress.    Appearance: She is well-developed.  HENT:     Head: Normocephalic and atraumatic.     Mouth/Throat:     Mouth: Mucous membranes are moist.     Comments: Dental decay, multiple broken teeth   Eyes:     Conjunctiva/sclera: Conjunctivae normal.  Cardiovascular:     Rate and Rhythm: Normal rate and regular rhythm.     Heart sounds: No murmur heard. Pulmonary:     Effort: Pulmonary effort is normal. No respiratory distress.     Breath sounds: Normal breath sounds.  Abdominal:     Palpations: Abdomen is soft.     Tenderness: There is  no abdominal tenderness.  Musculoskeletal:        General: No swelling.     Cervical back: Neck supple.  Skin:    General: Skin is warm and dry.     Capillary Refill: Capillary refill takes less than 2 seconds.  Neurological:     General: No focal deficit present.     Mental Status: She is alert.  Psychiatric:        Mood and Affect: Mood normal.     ED Results / Procedures / Treatments   Labs (all labs ordered are listed, but only abnormal results are displayed) Labs Reviewed - No data to display  EKG None  Radiology No results found.  Procedures Procedures    Medications Ordered in ED Medications - No data to display  ED Course/ Medical Decision Making/ A&P                                 Medical Decision Making Pt complains of dental pain   Amount and/or Complexity of Data  Reviewed Independent Historian: parent  Risk Prescription drug management. Risk Details: Pt advised to schedule to see dentist for evalaution.  Rx for antibiotics and naprosyn            Final Clinical Impression(s) / ED Diagnoses Final diagnoses:  None    Rx / DC Orders ED Discharge Orders          Ordered    amoxicillin (AMOXIL) 500 MG capsule  3 times daily        12/08/22 1438    naproxen (NAPROSYN) 500 MG tablet  2 times daily with meals        12/08/22 1438           An After Visit Summary was printed and given to the patient.    Elson Areas, PA-C 12/08/22 1649    Melene Plan, DO 12/09/22 [phone]

## 2023-05-20 ENCOUNTER — Other Ambulatory Visit: Payer: Self-pay

## 2023-05-20 ENCOUNTER — Encounter (HOSPITAL_BASED_OUTPATIENT_CLINIC_OR_DEPARTMENT_OTHER): Payer: Self-pay

## 2023-05-20 ENCOUNTER — Emergency Department (HOSPITAL_BASED_OUTPATIENT_CLINIC_OR_DEPARTMENT_OTHER)
Admission: EM | Admit: 2023-05-20 | Discharge: 2023-05-20 | Discharge status: Against Medical Advice | Payer: Medicaid Other | Attending: Emergency Medicine | Admitting: Emergency Medicine

## 2023-05-20 DIAGNOSIS — Z5321 Procedure and treatment not carried out due to patient leaving prior to being seen by health care provider: Secondary | ICD-10-CM | POA: Insufficient documentation

## 2023-05-20 DIAGNOSIS — M545 Low back pain, unspecified: Secondary | ICD-10-CM | POA: Insufficient documentation

## 2023-05-20 NOTE — ED Triage Notes (Signed)
Pt c/o lower back pain x 3 days  States no injury

## 2023-05-21 ENCOUNTER — Ambulatory Visit (HOSPITAL_COMMUNITY)
Admission: EM | Admit: 2023-05-21 | Discharge: 2023-05-21 | Disposition: A | Discharge status: Home / Self Care | Payer: Medicaid Other
